# Patient Record
Sex: Male | Born: 1970 | Race: White | Hispanic: No | Smoking: Never smoker
Health system: Southern US, Community
[De-identification: ages and names within clinical notes are randomized; demographics above are authoritative.]

## PROBLEM LIST (undated history)

## (undated) DIAGNOSIS — I10 Essential (primary) hypertension: Secondary | ICD-10-CM

## (undated) DIAGNOSIS — N189 Chronic kidney disease, unspecified: Secondary | ICD-10-CM

## (undated) DIAGNOSIS — E119 Type 2 diabetes mellitus without complications: Secondary | ICD-10-CM

## (undated) DIAGNOSIS — I219 Acute myocardial infarction, unspecified: Secondary | ICD-10-CM

## (undated) DIAGNOSIS — I2510 Atherosclerotic heart disease of native coronary artery without angina pectoris: Secondary | ICD-10-CM

## (undated) HISTORY — PX: APPENDECTOMY: SHX54

## (undated) HISTORY — DX: Essential (primary) hypertension: I10

## (undated) HISTORY — DX: Chronic kidney disease, unspecified: N18.9

## (undated) HISTORY — PX: TOOTH EXTRACTION: SUR596

## (undated) HISTORY — PX: EYE SURGERY: SHX253

## (undated) HISTORY — PX: CORONARY ARTERY BYPASS GRAFT: SHX141

---

## 2018-03-23 ENCOUNTER — Encounter (HOSPITAL_COMMUNITY): Payer: Self-pay | Admitting: *Deleted

## 2018-03-23 ENCOUNTER — Observation Stay (HOSPITAL_COMMUNITY)
Admission: EM | Admit: 2018-03-23 | Discharge: 2018-03-24 | Disposition: A | Discharge status: Home / Self Care | Payer: Managed Care, Other (non HMO) | Attending: Family Medicine | Admitting: Family Medicine

## 2018-03-23 ENCOUNTER — Other Ambulatory Visit: Payer: Self-pay

## 2018-03-23 ENCOUNTER — Emergency Department (HOSPITAL_COMMUNITY): Payer: Managed Care, Other (non HMO)

## 2018-03-23 DIAGNOSIS — I252 Old myocardial infarction: Secondary | ICD-10-CM | POA: Diagnosis not present

## 2018-03-23 DIAGNOSIS — Z951 Presence of aortocoronary bypass graft: Secondary | ICD-10-CM | POA: Diagnosis present

## 2018-03-23 DIAGNOSIS — E10319 Type 1 diabetes mellitus with unspecified diabetic retinopathy without macular edema: Secondary | ICD-10-CM | POA: Diagnosis present

## 2018-03-23 DIAGNOSIS — N183 Chronic kidney disease, stage 3 unspecified: Secondary | ICD-10-CM | POA: Diagnosis present

## 2018-03-23 DIAGNOSIS — Z794 Long term (current) use of insulin: Secondary | ICD-10-CM | POA: Diagnosis not present

## 2018-03-23 DIAGNOSIS — I129 Hypertensive chronic kidney disease with stage 1 through stage 4 chronic kidney disease, or unspecified chronic kidney disease: Secondary | ICD-10-CM | POA: Diagnosis not present

## 2018-03-23 DIAGNOSIS — Z8249 Family history of ischemic heart disease and other diseases of the circulatory system: Secondary | ICD-10-CM | POA: Insufficient documentation

## 2018-03-23 DIAGNOSIS — E875 Hyperkalemia: Secondary | ICD-10-CM | POA: Diagnosis present

## 2018-03-23 DIAGNOSIS — N184 Chronic kidney disease, stage 4 (severe): Secondary | ICD-10-CM | POA: Diagnosis not present

## 2018-03-23 DIAGNOSIS — I2510 Atherosclerotic heart disease of native coronary artery without angina pectoris: Secondary | ICD-10-CM | POA: Insufficient documentation

## 2018-03-23 DIAGNOSIS — Z9889 Other specified postprocedural states: Secondary | ICD-10-CM | POA: Insufficient documentation

## 2018-03-23 DIAGNOSIS — R0789 Other chest pain: Secondary | ICD-10-CM | POA: Diagnosis not present

## 2018-03-23 DIAGNOSIS — Z7982 Long term (current) use of aspirin: Secondary | ICD-10-CM | POA: Insufficient documentation

## 2018-03-23 DIAGNOSIS — Z79899 Other long term (current) drug therapy: Secondary | ICD-10-CM | POA: Insufficient documentation

## 2018-03-23 DIAGNOSIS — E1022 Type 1 diabetes mellitus with diabetic chronic kidney disease: Secondary | ICD-10-CM | POA: Insufficient documentation

## 2018-03-23 DIAGNOSIS — I25119 Atherosclerotic heart disease of native coronary artery with unspecified angina pectoris: Secondary | ICD-10-CM

## 2018-03-23 DIAGNOSIS — I10 Essential (primary) hypertension: Secondary | ICD-10-CM | POA: Diagnosis present

## 2018-03-23 DIAGNOSIS — R079 Chest pain, unspecified: Secondary | ICD-10-CM | POA: Diagnosis present

## 2018-03-23 HISTORY — DX: Acute myocardial infarction, unspecified: I21.9

## 2018-03-23 HISTORY — DX: Atherosclerotic heart disease of native coronary artery without angina pectoris: I25.10

## 2018-03-23 HISTORY — DX: Type 2 diabetes mellitus without complications: E11.9

## 2018-03-23 LAB — BASIC METABOLIC PANEL
Anion gap: 12 (ref 5–15)
BUN: 66 mg/dL — ABNORMAL HIGH (ref 6–20)
CHLORIDE: 105 mmol/L (ref 98–111)
CO2: 21 mmol/L — ABNORMAL LOW (ref 22–32)
Calcium: 9.2 mg/dL (ref 8.9–10.3)
Creatinine, Ser: 2.97 mg/dL — ABNORMAL HIGH (ref 0.61–1.24)
GFR calc Af Amer: 28 mL/min — ABNORMAL LOW (ref >60)
GFR calc non Af Amer: 24 mL/min — ABNORMAL LOW (ref >60)
Glucose, Bld: 113 mg/dL — ABNORMAL HIGH (ref 70–99)
Potassium: 5.2 mmol/L — ABNORMAL HIGH (ref 3.5–5.1)
Sodium: 138 mmol/L (ref 135–145)

## 2018-03-23 LAB — CBC
HCT: 35 % — ABNORMAL LOW (ref 39.0–52.0)
Hemoglobin: 11.4 g/dL — ABNORMAL LOW (ref 13.0–17.0)
MCH: 30.4 pg (ref 26.0–34.0)
MCHC: 32.6 g/dL (ref 30.0–36.0)
MCV: 93.3 fL (ref 80.0–100.0)
Platelets: 206 10*3/uL (ref 150–400)
RBC: 3.75 MIL/uL — ABNORMAL LOW (ref 4.22–5.81)
RDW: 11.8 % (ref 11.5–15.5)
WBC: 6.8 10*3/uL (ref 4.0–10.5)
nRBC: 0 % (ref 0.0–0.2)

## 2018-03-23 LAB — I-STAT TROPONIN, ED
TROPONIN I, POC: 0.01 ng/mL (ref 0.00–0.08)
Troponin i, poc: 0 ng/mL (ref 0.00–0.08)

## 2018-03-23 LAB — CBG MONITORING, ED: Glucose-Capillary: 203 mg/dL — ABNORMAL HIGH (ref 70–99)

## 2018-03-23 LAB — D-DIMER, QUANTITATIVE: D-Dimer, Quant: 0.27 ug/mL-FEU (ref 0.00–0.50)

## 2018-03-23 LAB — TROPONIN I: Troponin I: <0.03 ng/mL (ref <0.03)

## 2018-03-23 MED ORDER — NITROGLYCERIN 0.4 MG SL SUBL
0.4000 mg | SUBLINGUAL_TABLET | SUBLINGUAL | Status: DC | PRN
  Administered 2018-03-23 (×2): 0.4 mg via SUBLINGUAL
  Filled 2018-03-23: qty 1

## 2018-03-23 MED ORDER — ENOXAPARIN SODIUM 30 MG/0.3ML ~~LOC~~ SOLN
30.0000 mg | Freq: Every day | SUBCUTANEOUS | Status: DC
  Administered 2018-03-24: 30 mg via SUBCUTANEOUS
  Filled 2018-03-23: qty 0.3

## 2018-03-23 MED ORDER — ONDANSETRON HCL 4 MG/2ML IJ SOLN: 4.0000 mg | Freq: Four times a day (QID) | INTRAMUSCULAR | Status: DC | PRN

## 2018-03-23 MED ORDER — ASPIRIN 81 MG PO CHEW
324.0000 mg | CHEWABLE_TABLET | Freq: Once | ORAL | Status: AC
  Administered 2018-03-23: 324 mg via ORAL
  Filled 2018-03-23: qty 4

## 2018-03-23 MED ORDER — MORPHINE SULFATE (PF) 2 MG/ML IV SOLN: 2.0000 mg | INTRAVENOUS | Status: DC | PRN

## 2018-03-23 MED ORDER — FUROSEMIDE 20 MG PO TABS
20.0000 mg | ORAL_TABLET | Freq: Every day | ORAL | Status: DC
  Administered 2018-03-24: 20 mg via ORAL
  Filled 2018-03-23: qty 1

## 2018-03-23 MED ORDER — METOPROLOL TARTRATE 25 MG PO TABS
25.0000 mg | ORAL_TABLET | Freq: Three times a day (TID) | ORAL | Status: DC
  Administered 2018-03-23 – 2018-03-24 (×3): 25 mg via ORAL
  Filled 2018-03-23 (×3): qty 1

## 2018-03-23 MED ORDER — LACTATED RINGERS IV BOLUS
1000.0000 mL | Freq: Once | INTRAVENOUS | Status: AC
  Administered 2018-03-23: 1000 mL via INTRAVENOUS

## 2018-03-23 MED ORDER — ACETAMINOPHEN 325 MG PO TABS: 650.0000 mg | ORAL_TABLET | ORAL | Status: DC | PRN

## 2018-03-23 MED ORDER — ASPIRIN EC 81 MG PO TBEC
81.0000 mg | DELAYED_RELEASE_TABLET | Freq: Every day | ORAL | Status: DC
  Administered 2018-03-24: 81 mg via ORAL
  Filled 2018-03-23: qty 1

## 2018-03-23 MED ORDER — INSULIN ASPART 100 UNIT/ML ~~LOC~~ SOLN
0.0000 [IU] | SUBCUTANEOUS | Status: DC
  Administered 2018-03-24: 2 [IU] via SUBCUTANEOUS

## 2018-03-23 MED ORDER — ATORVASTATIN CALCIUM 80 MG PO TABS
80.0000 mg | ORAL_TABLET | Freq: Every evening | ORAL | Status: DC
  Administered 2018-03-23: 80 mg via ORAL
  Filled 2018-03-23: qty 1

## 2018-03-23 MED ORDER — INSULIN GLARGINE 100 UNIT/ML ~~LOC~~ SOLN
12.0000 [IU] | Freq: Every day | SUBCUTANEOUS | Status: DC
  Filled 2018-03-23: qty 0.12

## 2018-03-23 NOTE — Consult Note (Signed)
Cardiology Consultation:   Patient ID: Willie Fletcher MRN: [id number]; DOB: [birth year]  Admit date: 03/23/2018 Date of Consult: 03/23/2018  Primary Care Provider: No primary care provider on file. Primary Cardiologist: Dr Prentice Docker ([place], [place] at [facility]). Moved, has not established with cardiology here Primary Electrophysiologist:  None    Patient Profile:   Willie Fletcher is a 47 y.o. male with a hx of CAD with prior CABG who is being seen today for the evaluation of chest pain at the request of Dr Roel Cluck.  History of Present Illness:   Willie Fletcher 47 yo male history of type I DM, CAD with CABG in 2017 with LIMA-LAD, SVG-diag, SVG-OM in [place] (notes on Care everywhere). CKD IV, HTN. From notes he presented in 2017 with chest pain and positive biomarkers. LVEF 45% by echo at that time.   From patients reports he has done well since his CABG in 2017 with no significant chest pain or SOB. Reports this past Friday some generalized fatigue, nonspecific chest pain symptoms. Today around 11AM while at break from work developed chest pain. 5/10 pain left upper chest with some SOB, pressure in left arm. Worst with deep breathing. Constant pain for about 4 hours, better with SL NG in ER. Similar to his pain in 2017 prior to his CABG but more intense.    K 5. 2 Cr 2.97 WBC 6.8 Hgb 11.4 Plt 206  Trop neg x 1 EKG SR, LVH no acute ischemic changes CXR no acute process Past Medical History:  Diagnosis Date  . Coronary artery disease   . Diabetes mellitus without complication ([place])   . Myocardial infarct [facility])     Past Surgical History:  Procedure Laterality Date  . CORONARY ARTERY BYPASS GRAFT        Inpatient Medications: Scheduled Meds:  Continuous Infusions:  PRN Meds: nitroGLYCERIN  Allergies:   No Known Allergies  Social History:   Social History   Socioeconomic History  . Marital status: Single    Spouse name: Not on file  . Number of children:  Not on file  . Years of education: Not on file  . Highest education level: Not on file  Occupational History  . Not on file  Social Needs  . Financial resource strain: Not on file  . Food insecurity:    Worry: Not on file    Inability: Not on file  . Transportation needs:    Medical: Not on file    Non-medical: Not on file  Tobacco Use  . Smoking status: Never Smoker  . Smokeless tobacco: Never Used  Substance and Sexual Activity  . Alcohol use: Not on file  . Drug use: Not on file  . Sexual activity: Not on file  Lifestyle  . Physical activity:    Days per week: Not on file    Minutes per session: Not on file  . Stress: Not on file  Relationships  . Social connections:    Talks on phone: Not on file    Gets together: Not on file    Attends religious service: Not on file    Active member of club or organization: Not on file    Attends meetings of clubs or organizations: Not on file    Relationship status: Not on file  . Intimate partner violence:    Fear of current or ex partner: Not on file    Emotionally abused: Not on file    Physically abused: Not on file  Forced sexual activity: Not on file  Other Topics Concern  . Not on file  Social History Narrative  . Not on file    Family History:   Father with history of CAD  ROS:  Please see the history of present illness.  All other ROS reviewed and negative.     Physical Exam/Data:   Vitals:   03/23/18 1812 03/23/18 1816 03/23/18 1817 03/23/18 1845  BP:   (!) 152/90 (!) 143/80  Pulse: 63  65 63  Resp: 10  17 15   Temp:      TempSrc:      SpO2: 100%  100% 100%  Weight:  58.1 kg    Height:  5\' 7"  (1.702 m)     No intake or output data in the 24 hours ending 03/23/18 1938 Filed Weights   03/23/18 1816  Weight: 58.1 kg   Body mass index is 20.05 kg/m.  General:  Well nourished, well developed, in no acute distress HEENT: normal Lymph: no adenopathy Neck: no JVD Endocrine:  No thryomegaly Vascular:  No carotid bruits; FA pulses 2+ bilaterally without bruits  Cardiac:  normal S1, S2; RRR; no murmur Lungs:  clear to auscultation bilaterally, no wheezing, rhonchi or rales  Abd: soft, nontender, no hepatomegaly  Ext: no edema Musculoskeletal:  No deformities, BUE and BLE strength normal and equal Skin: warm and dry  Neuro:  CNs 2-12 intact, no focal abnormalities noted Psych:  Normal affect     Laboratory Data:  Chemistry Recent Labs  Lab 03/23/18 1610  NA 138  K 5.2*  CL 105  CO2 21*  GLUCOSE 113*  BUN 66*  CREATININE 2.97*  CALCIUM 9.2  GFRNONAA 24*  GFRAA 28*  ANIONGAP 12    No results for input(s): PROT, ALBUMIN, AST, ALT, ALKPHOS, BILITOT in the last 168 hours. Hematology Recent Labs  Lab 03/23/18 1610  WBC 6.8  RBC 3.75*  HGB 11.4*  HCT 35.0*  MCV 93.3  MCH 30.4  MCHC 32.6  RDW 11.8  PLT 206   Cardiac EnzymesNo results for input(s): TROPONINI in the last 168 hours.  Recent Labs  Lab 03/23/18 1611  TROPIPOC 0.00    BNPNo results for input(s): BNP, PROBNP in the last 168 hours.  DDimer No results for input(s): DDIMER in the last 168 hours.  Radiology/Studies:  Dg Chest 2 View  Result Date: 03/23/2018 CLINICAL DATA:  Chest pain for 6 hours.  History of CABG. EXAM: CHEST - 2 VIEW COMPARISON:  None. FINDINGS: Cardiomediastinal silhouette is normal. Status post median sternotomy for CABG. No pleural effusions or focal consolidations. Trachea projects midline and there is no pneumothorax. Soft tissue planes and included osseous structures are non-suspicious. IMPRESSION: No active cardiopulmonary process. Electronically Signed   By: Elon Alas M.D.   On: 03/23/2018 17:33     08/2015 Cath Coronary Angiography:                                  Dominance: Right                                                                          Left  Main                                         There was mild diffuse disease of the entire vessel segment of the         left main artery. The left main was large. Distal flow was normal.                                              Left Anterior Descending                                There was severe diffuse disease of the entire vessel segment of the        left anterior descending artery (LAD). The LAD was small. Distal         flow was normal.                                                                           There was severe diffuse disease of the entire vessel segment of the        first diagonal branch (Diagonal 1) of the LAD. The Diagonal 1 was         small. Distal flow was normal.                                                                Left Circumflex                                     There was a single discrete total occlusion of the proximal segment        of the left circumflex artery (LCX). Distal flow was decreased          (TIMI Grade 1).                                                                        Right Coronary Artery                                  There was a single discrete total occlusion of the mid segment of         the right coronary artery (RCA). The RCA was small. Distal flow         was decreased (TIMI  Grade 1) and was via collaterals from the LAD         and collaterals from the RCA.                                                                  Ramus                                          There was severe diffuse disease of the proximal segment of the          ramus. The ramus was small. Distal flow was normal.                                                  Vascular Access:                                    Vascular Access Management:                               Mechanical Compression of the right radial artery access site was         performed.                                                                        Conclusions:                                      * Three vessel coronary artery disease (LAD, LCX and RCA)                                                   Complications/Events:                                  The patient had no complications during these procedures.                                                   Comments:                                        Pt referred for  diagnostic coronary angiography to further evaluate chest     pain and positive biomarkers. Pt is a untreated Type 1 diabetic.         Coronary angiography showed severe diffuse 3VD. CTS evaluation to        consider for surgical bypass. Continue optimal medical therapy.                                                 The attending physician was present for the entire procedure.                                                    Dr. Noralyn Pick, M.D. was present during the moderate sedation  intraservice    time as documented by the sedation nurse. Case time = 00:11.                                                    Dr. Noralyn Pick, M.D. performed the coronary angiography and left heart      catheterization.                                 08/2015 TEE SUMMARY:  1. TEE pre and post CABG surgery 2. Prebypass: LV systolic function is mildly depressed. LVEF is estimated to be 45%. There are wall motion abnormalities as noted in the chart. RV function is normal. There is no hemodynamically significant valvular dysfunction. There is no evidence of PFO by colorflow doppler. 3. Postbypass: LV and RV function are improved. The LVEF is estimated to be 50%. There are wall motion abnormalities as described in the chart. There is no change in valve function. The remainder of the examination is unchanged. 4. See remainder of report for additional findings.   08/2015 echo SUMMARY:  1. The left ventricular chamber size is normal. Borderline concentric left ventricular hypertrophy is observed.  The quantitative left ventricular ejection fraction by biplane Simpson's method is 43%. There are left ventricular segmental wall motion abnormalities present, as shown in the diagram below.  2. Right ventricular chamber size, wall thickness, and systolic function are within normal limits. 3. No signfiicant valvular abnormalities. 4. Other details as noted below. 5. No prior study available for comparison.      Assessment and Plan:   1. CAD - history of CAD with 3 vessel CABG in 2017 at [facility] - mixed chest pain symptoms. Lasted about 4 hours, worst with deep breathing. Better with SL NG, similar to his pain prior to CABG in 2017 - currently pain free, initial enzymes and EKG without acute ischemic changes - due to his CKD would require high risk findings to consider cath. Cycle  enzymes and EKGs overnight, obtain echo tomorrow AM. Keep npo at midnight, likely would plan for noninvasive stress testing unless high risk findings overnight. - if recurrent pain, EKG or enzyme changes would start hep gtt - continue ASA, atorva lisinorpil metoprolol from his home regimen  2. CKD IV -  Cr last year from care everywhere range 2.4 to 3.1, within range on admisison.   For questions or updates, please contact [place] Please consult [email or web address] for contact info under     Signed, Carlyle Dolly, MD  03/23/2018 7:38 PM

## 2018-03-23 NOTE — ED Provider Notes (Signed)
[place] EMERGENCY DEPARTMENT Provider Note   CSN: [id number] Arrival date & time: 03/23/18  1547     History   Chief Complaint Chief Complaint  Patient presents with  . Chest Pain    HPI Willie Fletcher is a 47 y.o. male.  Patient is a 47 year old male with past medical history significant for diabetes, hypertension coronary artery disease status post three-vessel CABG in 2017 presenting for left-sided chest pain that started when he is at work today.  Patient states that he took his medications as prescribed pain started about 12:30 PM, started to decrease 30 minutes prior to arrival.  Patient states that he is still having active left chest sided chest pain at this time with radiation down his left arm with mild numbness.  Patient does note that he is now adherent to his insulin and hypertensive medications, although has never had chest pain since his CABG.  The history is provided by the patient, medical records and the spouse. No language interpreter was used.    Past Medical History:  Diagnosis Date  . Coronary artery disease   . Diabetes mellitus without complication ([place])   . Myocardial infarct ([place])     There are no active problems to display for this patient.   Past Surgical History:  Procedure Laterality Date  . CORONARY ARTERY BYPASS GRAFT          Home Medications    Prior to Admission medications   Not on File    Family History History reviewed. No pertinent family history.  Social History Social History   Tobacco Use  . Smoking status: Never Smoker  . Smokeless tobacco: Never Used  Substance Use Topics  . Alcohol use: Not on file  . Drug use: Not on file     Allergies   Patient has no known allergies.   Review of Systems Review of Systems  Constitutional: Positive for activity change. Negative for chills and fever.  HENT: Negative for ear pain and sore throat.   Eyes: Negative for pain and visual disturbance.    Respiratory: Positive for chest tightness. Negative for cough and shortness of breath.   Cardiovascular: Positive for chest pain. Negative for palpitations.  Gastrointestinal: Negative for abdominal pain and vomiting.  Genitourinary: Negative for dysuria and hematuria.  Musculoskeletal: Negative for arthralgias and back pain.  Skin: Negative for color change and rash.  Neurological: Negative for seizures and syncope.  All other systems reviewed and are negative.    Physical Exam Updated Vital Signs BP (!) 163/80 (BP Location: Right Arm)   Pulse 65   Temp (!) 97.1 F (36.2 C) (Oral)   Resp 18   SpO2 100%   Physical Exam  Constitutional: He appears well-developed and well-nourished.  HENT:  Head: Normocephalic and atraumatic.  Eyes: Conjunctivae are normal.  Neck: Neck supple.  Cardiovascular: Normal rate and regular rhythm.  No murmur heard. Pulmonary/Chest: Effort normal and breath sounds normal. No respiratory distress.  Abdominal: Soft. There is no tenderness.  Musculoskeletal: He exhibits no edema.  Neurological: He is alert.  Skin: Skin is warm and dry.  Psychiatric: He has a normal mood and affect.  Nursing note and vitals reviewed.    ED Treatments / Results  Labs (all labs ordered are listed, but only abnormal results are displayed) Labs Reviewed  BASIC METABOLIC PANEL - Abnormal; Notable for the following components:      Result Value   Potassium 5.2 (*)    CO2  21 (*)    Glucose, Bld 113 (*)    BUN 66 (*)    Creatinine, Ser 2.97 (*)    GFR calc non Af Amer 24 (*)    GFR calc Af Amer 28 (*)    All other components within normal limits  CBC - Abnormal; Notable for the following components:   RBC 3.75 (*)    Hemoglobin 11.4 (*)    HCT 35.0 (*)    All other components within normal limits  I-STAT TROPONIN, ED    EKG None  Radiology Dg Chest 2 View  Result Date: 03/23/2018 CLINICAL DATA:  Chest pain for 6 hours.  History of CABG. EXAM: CHEST - 2  VIEW COMPARISON:  None. FINDINGS: Cardiomediastinal silhouette is normal. Status post median sternotomy for CABG. No pleural effusions or focal consolidations. Trachea projects midline and there is no pneumothorax. Soft tissue planes and included osseous structures are non-suspicious. IMPRESSION: No active cardiopulmonary process. Electronically Signed   By: Elon Alas M.D.   On: 03/23/2018 17:33    Procedures Procedures (including critical care time)  Medications Ordered in ED Medications - No data to display   Initial Impression / Assessment and Plan / ED Course  I have reviewed the triage vital signs and the nursing notes.  Pertinent labs & imaging results that were available during my care of the patient were reviewed by me and considered in my medical decision making (see chart for details).     Patient is a 47 year old male with past medical history significant for insulin-dependent diabetes, hypertension, three-vessel CABG in 2017 presenting for left-sided chest pain, described as squeezing, radiation down his left arm that started at work today.  Initial blood pressure upon arrival was systolic of 342.  Patient states that he is compliant with his antihypertensives.  324 aspirin was given.  Patient was given sublingual nitro which he states resolved completely his chest pain.  EKG was reviewed by myself and my attending physician normal sinus rhythm, ST segment elevation in V3 lead with no reciprocal changes, no previous tracing available.  Chest x-ray unremarkable.  Initial troponin negative.  Patient has a history of CKD, creatinine 1.9. Patient will be admitted for further of observation and management for high risk chest pain. Case was discussed with Dr. Harl Bowie of Cardiology service who states that they will come see the patient.   Final Clinical Impressions(s) / ED Diagnoses   Final diagnoses:  Chest pain, unspecified type    ED Discharge Orders    None         Erskine Squibb, MD 03/23/18 2056    Merrily Pew, MD 03/23/18 2329

## 2018-03-23 NOTE — ED Notes (Signed)
Pt sttes chest pain free, but c/o headaCHE.

## 2018-03-23 NOTE — ED Triage Notes (Signed)
Pt in c/o left sided chest pain that radiates down his arm, similar pain to his previous MI with triple bypass, no distress noted on arrival, denies shortness of breath, pain increases with inspiration

## 2018-03-23 NOTE — Progress Notes (Signed)
   03/23/18 2253  Vitals  Temp (!) 97.5 F (36.4 C)  Temp Source Oral  BP (!) 161/76  MAP (mmHg) 103  BP Location Right Arm  BP Method Automatic  Patient Position (if appropriate) Lying  Pulse Rate 66  Pulse Rate Source Monitor  Resp 16  Oxygen Therapy  SpO2 100 %  O2 Device Room Air  Height and Weight  Height 5\' 7"  (1.702 m)  Weight 55.9 kg  Type of Scale Used Standing (scale C (11-20))  Type of Weight Actual  BSA (Calculated - sq m) 1.63 sq meters  BMI (Calculated) 19.29  Weight in (lb) to have BMI = 25 159.3  Admitted pt to rm 3E15 from ED, pt alert and oriented, denied chest pain at this time, oriented to room, call bell placed within reach, placed on cardiac monitor, CCMD made aware.

## 2018-03-23 NOTE — H&P (Signed)
Willie Fletcher YPP:[id number] DOB: Oct 02, 1970 DOA: 03/23/2018     PCP: No primary care provider on file.   Outpatient Specialists:   CARDS:   Dr. Harlene Ramus, Phylliss Blakes, [place]  Endocrinology: Michaelene Song, DO Patient arrived to ER on 03/23/18 at 1547  Patient coming from: home Lives  With SO   Chief Complaint:  Chief Complaint  Patient presents with  . Chest Pain    HPI: Willie Fletcher is a 47 y.o. male with medical history significant of DM1, CKD, CAD sp CABG, HTN   Presented with left-sided chest pain radiates down his arm similar to prior MI not short of breath pain is worse with inspiration and started while at work today at around 12:30 PM after his mid took his pain medicine and seemed to decrease initially on arrival to emergency department he was still having some mild chest pain. Lasted for 4 h Pain described as squeezing similar to prior MI Initially on arrival blood pressure was up to 190 he has been taking his medications for blood pressure as prescribed patient was given 324 of aspirin and sublingual nitro which helped resolve his chest pain Recently moved to [facility]  Regarding pertinent Chronic problems:   CABG (LIMA-LAD, VG-diagonal, SVG-OM) 2017 History of diabetes type 1 on insulin his A1c was about 7% On Dexcom continuous glucose monitor  BG has been doing well  hypertension on ACE inhibitor  While in ER: EKG appeared to have ST segment elevation in V3 but no reciprocal changes since patient recently moved to [facility] no prior tracing noted Initial troponin unremarkable Now bp down to 143/80  Currently CP free  The following Work up has been ordered so far:  Orders Placed This Encounter  Procedures  . DG Chest 2 View  . Basic metabolic panel  . CBC  . Cardiac monitoring  . Saline Lock IV, Maintain IV access  . Consult for [facility] Admission  . Pulse oximetry, continuous  . I-stat troponin, ED  . I-stat  troponin, ED  . ED EKG within 10 minutes   Following Medications were ordered in ER: Medications  nitroGLYCERIN (NITROSTAT) SL tablet 0.4 mg (0.4 mg Sublingual Given 03/23/18 1818)  lactated ringers bolus 1,000 mL (1,000 mLs Intravenous New Bag/Given 03/23/18 1850)  aspirin chewable tablet 324 mg (324 mg Oral Given 03/23/18 1810)    Significant initial  Findings: Abnormal Labs Reviewed  BASIC METABOLIC PANEL - Abnormal; Notable for the following components:      Result Value   Potassium 5.2 (*)    CO2 21 (*)    Glucose, Bld 113 (*)    BUN 66 (*)    Creatinine, Ser 2.97 (*)    GFR calc non Af Amer 24 (*)    GFR calc Af Amer 28 (*)    All other components within normal limits  CBC - Abnormal; Notable for the following components:   RBC 3.75 (*)    Hemoglobin 11.4 (*)    HCT 35.0 (*)    All other components within normal limits    Lactic Acid, Venous No results found for: LATICACIDVEN  Na 138 K 5.2 D.dimer 0.27 Cr at  baseline  As per Epic Lab Results  Component Value Date   CREATININE 2.97 (H) 03/23/2018   WBC  6.8   HG/HCT stable,      Component Value Date/Time   HGB 11.4 (L) 03/23/2018 1610   HCT 35.0 (L) 03/23/2018 1610  Troponin (Point of Care Test) Recent Labs    03/23/18 1611  TROPIPOC 0.00     BNP (last 3 results) No results for input(s): BNP in the last 8760 hours.  ProBNP (last 3 results) No results for input(s): PROBNP in the last 8760 hours.     UA  not ordered    CXR -  NON acute    ECG:  Personally reviewed by me showing: HR : 63 Rhythm:  NSR,    nonspecific changes, ST elevation in V3 QTC 421     ED Triage Vitals  Enc Vitals Group     BP 03/23/18 1555 (!) 163/80     Pulse Rate 03/23/18 1555 65     Resp 03/23/18 1555 18     Temp 03/23/18 1555 (!) 97.1 F (36.2 C)     Temp Source 03/23/18 1555 Oral     SpO2 03/23/18 1555 100 %     Weight 03/23/18 1816 128 lb (58.1 kg)     Height 03/23/18 1816 5\' 7"  (1.702 m)     Head  Circumference --      Peak Flow --      Pain Score 03/23/18 1812 2     Pain Loc --      Pain Edu? --      Excl. in [place]? --   TMAX(24)@       Latest  Blood pressure (!) 143/80, pulse 63, temperature (!) 97.1 F (36.2 C), temperature source Oral, resp. rate 15, height 5\' 7"  (1.702 m), weight 58.1 kg, SpO2 100 %.    ER Provider Called:  Cardiology     Dr.Branch They Recommend NPO post midnight for possible stress test Will see     in ER  Hospitalist was called for admission for chest pain    Review of Systems:    Pertinent positives include:  chest pain,   Constitutional:  No weight loss, night sweats, Fevers, chills, fatigue, weight loss  HEENT:  No headaches, Difficulty swallowing,Tooth/dental problems,Sore throat,  No sneezing, itching, ear ache, nasal congestion, post nasal drip,  Cardio-vascular:  NoOrthopnea, PND, anasarca, dizziness, palpitations.no Bilateral lower extremity swelling  GI:  No heartburn, indigestion, abdominal pain, nausea, vomiting, diarrhea, change in bowel habits, loss of appetite, melena, blood in stool, hematemesis Resp:  no shortness of breath at rest. No dyspnea on exertion, No excess mucus, no productive cough, No non-productive cough, No coughing up of blood.No change in color of mucus.No wheezing. Skin:  no rash or lesions. No jaundice GU:  no dysuria, change in color of urine, no urgency or frequency. No straining to urinate.  No flank pain.  Musculoskeletal:  No joint pain or no joint swelling. No decreased range of motion. No back pain.  Psych:  No change in mood or affect. No depression or anxiety. No memory loss.  Neuro: no localizing neurological complaints, no tingling, no weakness, no double vision, no gait abnormality, no slurred speech, no confusion  All systems reviewed and apart from [place] all are negative  Past Medical History:   Past Medical History:  Diagnosis Date  . Coronary artery disease   . Diabetes mellitus without  complication ([place])   . Myocardial infarct [facility])       Past Surgical History:  Procedure Laterality Date  . CORONARY ARTERY BYPASS GRAFT      Social History:  Ambulatory   Independently     reports that he has never smoked. He has never used smokeless tobacco. His  alcohol and drug histories are not on file.     Family History:   Family History  Problem Relation Age of Onset  . CAD Father   . Hypertension Father   . Hypertension Other   . Cancer Neg Hx   . Diabetes Neg Hx   . Stroke Neg Hx     Allergies: No Known Allergies   Prior to Admission medications   Medication Sig Start Date End Date Taking? Authorizing Provider  amLODipine (NORVASC) 5 MG tablet Take 5 mg by mouth daily. 02/06/18  Yes [provider]  aspirin EC 81 MG tablet Take 81 mg by mouth daily.   Yes [provider]  atorvastatin (LIPITOR) 80 MG tablet Take 80 mg by mouth every evening. 12/22/17  Yes [provider]  furosemide (LASIX) 20 MG tablet Take 20 mg by mouth daily.   Yes [provider]  Insulin Glargine (BASAGLAR KWIKPEN) 100 UNIT/ML SOPN Inject 12 Units into the skin daily. 02/23/18  Yes [provider]  lisinopril (PRINIVIL,ZESTRIL) 10 MG tablet Take 10 mg by mouth daily. 02/06/18  Yes [provider]  metoprolol tartrate (LOPRESSOR) 25 MG tablet Take 25 mg by mouth every 8 (eight) hours. 02/06/18  Yes [provider]  Multiple Vitamin (MULTIVITAMIN WITH MINERALS) TABS tablet Take 1 tablet by mouth daily.   Yes [provider]  NOVOLOG FLEXPEN 100 UNIT/ML FlexPen Inject 8-20 Units as directed 4 (four) times daily. 01/29/18  Yes [provider]   Physical Exam: Blood pressure (!) 143/80, pulse 63, temperature (!) 97.1 F (36.2 C), temperature source Oral, resp. rate 15, height 5\' 7"  (1.702 m), weight 58.1 kg, SpO2 100 %. 1. General:  in No Acute distress  well  -appearing 2. Psychological: Alert and  Oriented 3.  Head/ENT:   Moist  Mucous Membranes                          Head Non traumatic, neck supple                          Poor Dentition 4. SKIN:  decreased Skin turgor,  Skin clean Dry and intact no rash 5. Heart: Regular rate and rhythm no Murmur, no Rub or gallop 6. Lungs:  Clear to auscultation bilaterally, no wheezes or crackles   7. Abdomen: Soft,  non-tender, Non distended      bowel sounds present 8. Lower extremities: no clubbing, cyanosis, or  edema 9. Neurologically Grossly intact, moving all 4 extremities equally   10. MSK: Normal range of motion   LABS:     Recent Labs  Lab 03/23/18 1610  WBC 6.8  HGB 11.4*  HCT 35.0*  MCV 93.3  PLT 563   Basic Metabolic Panel: Recent Labs  Lab 03/23/18 1610  NA 138  K 5.2*  CL 105  CO2 21*  GLUCOSE 113*  BUN 66*  CREATININE 2.97*  CALCIUM 9.2     No results for input(s): AST, ALT, ALKPHOS, BILITOT, PROT, ALBUMIN in the last 168 hours. No results for input(s): LIPASE, AMYLASE in the last 168 hours. No results for input(s): AMMONIA in the last 168 hours.    HbA1C: No results for input(s): HGBA1C in the last 72 hours. CBG: No results for input(s): GLUCAP in the last 168 hours.    Urine analysis: No results found for: COLORURINE, APPEARANCEUR, LABSPEC, [place], GLUCOSEU, HGBUR, BILIRUBINUR, KETONESUR, PROTEINUR, UROBILINOGEN, NITRITE,  LEUKOCYTESUR     Cultures: No results found for: SDES, [place], CULT, REPTSTATUS   Radiological Exams on Admission: Dg Chest 2 View  Result Date: 03/23/2018 CLINICAL DATA:  Chest pain for 6 hours.  History of CABG. EXAM: CHEST - 2 VIEW COMPARISON:  None. FINDINGS: Cardiomediastinal silhouette is normal. Status post median sternotomy for CABG. No pleural effusions or focal consolidations. Trachea projects midline and there is no pneumothorax. Soft tissue planes and included osseous structures are non-suspicious. IMPRESSION: No active cardiopulmonary process. Electronically Signed   By:  Elon Alas M.D.   On: 03/23/2018 17:33    Chart has been reviewed    Assessment/Plan  47 y.o. male with medical history significant of DM1, CKD, CAD sp CABG, HTN   Admitted for chest pain   Present on Admission: . Chest pain - - H=  1  ,E=  1 ,A= 1 , R  2   , T 0  ,  for the  Total of 5 therefore will admit for observation and further evaluation ( Risk of MACE: Scores 0-3  of 0.9-1.7%.,  4-6: 12-16.6% , Scores ?7: 50-65% )     - monitor on telemetry, cycle cardiac enzymes, obtain serial ECG and  ECHO in AM.   - Daily aspirin -  Further risk stratify with lipid panel, hgA1C, obtain TSH.  Make sure patient is on Aspirin.  We will notify cardiology regarding patient's admission. Further management depends on pending  workup Appreciate cardiology input possibly stress test tomorrow Keep n.p.o. post midnight  . Hyperkalemia -replace and check magnesium level . DM (diabetes mellitus), type 1 ([place]) -  - Order Sensitive  SSI   - continue home insulin regimen   -  check TSH and HgA1C    . Essential hypertension stable resume home medications . CAD (coronary artery disease) -appreciate cardiology input make sure on daily aspirin and statin Continue Lopressor . CKD (chronic kidney disease), stage III (HCC) avoid nephrotoxic medications currently creatinine at baseline will need to follow-up with nephrology as an outpatient   Other plan as per orders.  DVT prophylaxis:    SCD  Code Status: limited code as per patient   I had personally discussed CODE STATUS with patient  Family Communication:   Family not at  Bedside    Disposition Plan:      To home once workup is complete and patient is stable                      Consults called: Cardiology have seen in consult   Admission status:    Obs    Level of care     tele  For [place] 03/23/2018, 10:19 PM    [facility]  Pager [phone]   after 2 AM please page floor coverage PA If 7AM-7PM,  please contact the day team taking care of the patient  [email or web address]  Password [email or web address]

## 2018-03-23 NOTE — ED Notes (Signed)
Pt asked if he could take his home night-time meds.  RN contacted admitting provider who indicated she prefer he wait, she would be seeing him next.  Pt agreeable.

## 2018-03-24 ENCOUNTER — Observation Stay (HOSPITAL_BASED_OUTPATIENT_CLINIC_OR_DEPARTMENT_OTHER): Payer: Managed Care, Other (non HMO)

## 2018-03-24 DIAGNOSIS — R079 Chest pain, unspecified: Secondary | ICD-10-CM

## 2018-03-24 DIAGNOSIS — N183 Chronic kidney disease, stage 3 (moderate): Secondary | ICD-10-CM | POA: Diagnosis not present

## 2018-03-24 DIAGNOSIS — R072 Precordial pain: Secondary | ICD-10-CM

## 2018-03-24 DIAGNOSIS — E1022 Type 1 diabetes mellitus with diabetic chronic kidney disease: Secondary | ICD-10-CM | POA: Diagnosis not present

## 2018-03-24 DIAGNOSIS — I2510 Atherosclerotic heart disease of native coronary artery without angina pectoris: Secondary | ICD-10-CM | POA: Diagnosis not present

## 2018-03-24 LAB — NM MYOCAR MULTI W/SPECT W/WALL MOTION / EF
Estimated workload: 1 METS
MPHR: 172 {beats}/min
Peak HR: 83 {beats}/min
Percent HR: 48 %
Rest HR: 64 {beats}/min

## 2018-03-24 LAB — GLUCOSE, CAPILLARY
Glucose-Capillary: 134 mg/dL — ABNORMAL HIGH (ref 70–99)
Glucose-Capillary: 152 mg/dL — ABNORMAL HIGH (ref 70–99)
Glucose-Capillary: 174 mg/dL — ABNORMAL HIGH (ref 70–99)
Glucose-Capillary: 48 mg/dL — ABNORMAL LOW (ref 70–99)
Glucose-Capillary: 65 mg/dL — ABNORMAL LOW (ref 70–99)
Glucose-Capillary: 83 mg/dL (ref 70–99)

## 2018-03-24 LAB — HEMOGLOBIN A1C
Hgb A1c MFr Bld: 6.6 % — ABNORMAL HIGH (ref 4.8–5.6)
Mean Plasma Glucose: 142.72 mg/dL

## 2018-03-24 LAB — TROPONIN I
Troponin I: <0.03 ng/mL (ref <0.03)
Troponin I: <0.03 ng/mL (ref <0.03)

## 2018-03-24 LAB — ECHOCARDIOGRAM COMPLETE
Height: 67 in
Weight: 1964.8 oz

## 2018-03-24 LAB — HIV ANTIBODY (ROUTINE TESTING W REFLEX): HIV Screen 4th Generation wRfx: NONREACTIVE

## 2018-03-24 MED ORDER — REGADENOSON 0.4 MG/5ML IV SOLN
0.4000 mg | Freq: Once | INTRAVENOUS | Status: AC
  Administered 2018-03-24: 0.4 mg via INTRAVENOUS
  Filled 2018-03-24: qty 5

## 2018-03-24 MED ORDER — DEXTROSE 50 % IV SOLN
12.5000 g | INTRAVENOUS | Status: AC
  Administered 2018-03-24: 12.5 g via INTRAVENOUS

## 2018-03-24 MED ORDER — ASPIRIN EC 81 MG PO TBEC: 81.0000 mg | DELAYED_RELEASE_TABLET | Freq: Every day | ORAL | 11 refills | Status: DC

## 2018-03-24 MED ORDER — DEXTROSE 50 % IV SOLN
INTRAVENOUS | Status: AC
  Filled 2018-03-24: qty 50

## 2018-03-24 MED ORDER — REGADENOSON 0.4 MG/5ML IV SOLN
INTRAVENOUS | Status: AC
  Filled 2018-03-24: qty 5

## 2018-03-24 MED ORDER — TECHNETIUM TC 99M TETROFOSMIN IV KIT
10.0000 | PACK | Freq: Once | INTRAVENOUS | Status: AC | PRN
  Administered 2018-03-24: 10 via INTRAVENOUS

## 2018-03-24 MED ORDER — ACETAMINOPHEN 325 MG PO TABS: 650.0000 mg | ORAL_TABLET | ORAL | 0 refills | Status: AC | PRN

## 2018-03-24 MED ORDER — DEXTROSE-NACL 5-0.45 % IV SOLN
INTRAVENOUS | Status: DC
  Administered 2018-03-24: 10:00:00 via INTRAVENOUS

## 2018-03-24 MED ORDER — TECHNETIUM TC 99M SESTAMIBI GENERIC - CARDIOLITE
30.0000 | Freq: Once | INTRAVENOUS | Status: AC | PRN
  Administered 2018-03-24: 30 via INTRAVENOUS

## 2018-03-24 NOTE — Discharge Summary (Signed)
Willie Fletcher, is a 47 y.o. male  DOB 1970/10/04  MRN [id number].  Admission date:  03/23/2018  Admitting Physician  Toy Baker, MD  Discharge Date:  03/24/2018   Primary MD  Patient, No Pcp Per  Recommendations for primary care physician for things to follow:   1)Stress Test Results--- Normal resting and stress perfusion. No ischemia or infarction EF estimated at 50% but normal by Echo done same day Overall Study Impression Myocardial perfusion is normal. The study is normal. This is a low risk study. Overall left ventricular systolic function was normal. LV cavity size is normal.  There is no prior study for comparison  2) you have stage III chronic kidney disease so Avoid ibuprofen/Advil/Aleve/Motrin/Goody Powders/Naproxen/BC powders/Meloxicam/Diclofenac/Indomethacin and other Nonsteroidal anti-inflammatory medications as these will make you more likely to bleed and can cause stomach ulcers, can also cause Kidney problems.   2) diabetic diet as well as low-salt low-cholesterol diet advised  3) continue all your medications as previously prescribed  4) follow up to primary care physician for reevaluation, please call or return if chest discomfort persist   Admission Diagnosis  Chest pain, unspecified type [R07.9]   Discharge Diagnosis  Chest pain, unspecified type [R07.9]    Active Problems:   Chest pain   Hyperkalemia   DM (diabetes mellitus), type 1 ([place])   Essential hypertension   CAD (coronary artery disease)   CKD (chronic kidney disease), stage III ([place])      Past Medical History:  Diagnosis Date  . Coronary artery disease   . Diabetes mellitus without complication ([place])   . Myocardial infarct [facility])     Past Surgical History:  Procedure Laterality Date  . CORONARY ARTERY BYPASS GRAFT       HPI  from the history and physical done on the day of admission:   HPI: Willie Fletcher is a 47 y.o. male with medical history significant of DM1, CKD, CAD sp CABG, HTN   Presented with left-sided chest pain radiates down his arm similar to prior MI not short of breath pain is worse with inspiration and started while at work today at around 12:30 PM after his mid took his pain medicine and seemed to decrease initially on arrival to emergency department he was still having some mild chest pain. Lasted for 4 h Pain described as squeezing similar to prior MI Initially on arrival blood pressure was up to 190 he has been taking his medications for blood pressure as prescribed patient was given 324 of aspirin and sublingual nitro which helped resolve his chest pain Recently moved to [facility]  Regarding pertinent Chronic problems:   CABG (LIMA-LAD, VG-diagonal, SVG-OM) 2017 History of diabetes type 1 on insulin his A1c was about 7% On Dexcom continuous glucose monitor  BG has been doing well  hypertension on ACE inhibitor  While in ER: EKG appeared to have ST segment elevation in V3 but no reciprocal changes since patient recently moved to [place] no prior tracing noted Initial troponin  unremarkable Now bp down to 143/80  Currently CP free   Hospital Course:     1)Stress Test Results--- Normal resting and stress perfusion. No ischemia or infarction EF estimated at 50% but normal by Echo done same day Overall Study Impression Myocardial perfusion is normal. The study is normal. This is a low risk study. Overall left ventricular systolic function was normal. LV cavity size is normal.  There is no prior study for comparison    Plan:- 1)Atypical chest pain--history of CAD with prior CABG , ruled out for ACS by cardiac enzymes and EKG, patient remains chest pain-free, nuclear stress test without reversible ischemia, cardiology consult appreciated, okay to discharge home with outpatient follow-up, continue aspirin, continue Lipitor, metoprolol and  lisinopril  2)DM 1-A1c 6.6 reflecting excellent diabetic control, continue current diabetic regimen with insulin   3)HTN--- stable, okay to continue amlodipine, metoprolol lisinopril  4) CKD (chronic kidney disease), stage III ----avoid nephrotoxic medications/agenta, review of care everywhere reveals that patient renal function currently at baseline with creatinine close to 3  Discharge Condition: stable  Follow UP--with PCP for recheck sooner if symptoms persist   Consults obtained -cardiology  Diet and Activity recommendation:  As advised  Discharge Instructions    Discharge Instructions    Call MD for:  difficulty breathing, headache or visual disturbances   Complete by:  As directed    Call MD for:  persistant dizziness or light-headedness   Complete by:  As directed    Call MD for:  persistant nausea and vomiting   Complete by:  As directed    Call MD for:  temperature >100.4   Complete by:  As directed    Diet - low sodium heart healthy   Complete by:  As directed    Diet Carb Modified   Complete by:  As directed    Discharge instructions   Complete by:  As directed    1)Stress Test Results--- Normal resting and stress perfusion. No ischemia or infarction EF estimated at 50% but normal by Echo done same day Overall Study Impression Myocardial perfusion is normal. The study is normal. This is a low risk study. Overall left ventricular systolic function was normal. LV cavity size is normal.  There is no prior study for comparison  2) you have stage III chronic kidney disease so Avoid ibuprofen/Advil/Aleve/Motrin/Goody Powders/Naproxen/BC powders/Meloxicam/Diclofenac/Indomethacin and other Nonsteroidal anti-inflammatory medications as these will make you more likely to bleed and can cause stomach ulcers, can also cause Kidney problems.   2) diabetic diet as well as low-salt low-cholesterol diet advised  3) continue all your medications as previously prescribed  4)  follow up to primary care physician for reevaluation, please call or return if chest discomfort persist   Increase activity slowly   Complete by:  As directed        Discharge Medications     Allergies as of 03/24/2018   No Known Allergies     Medication List    TAKE these medications   acetaminophen 325 MG tablet Commonly known as:  TYLENOL Take 2 tablets (650 mg total) by mouth every 4 (four) hours as needed for mild pain, moderate pain, fever or headache.   amLODipine 5 MG tablet Commonly known as:  NORVASC Take 5 mg by mouth daily.   aspirin EC 81 MG tablet Take 1 tablet (81 mg total) by mouth daily. With Food What changed:  additional instructions   atorvastatin 80 MG tablet Commonly known as:  LIPITOR Take  80 mg by mouth every evening.   BASAGLAR KWIKPEN 100 UNIT/ML Sopn Inject 12 Units into the skin daily.   furosemide 20 MG tablet Commonly known as:  LASIX Take 20 mg by mouth daily.   lisinopril 10 MG tablet Commonly known as:  PRINIVIL,ZESTRIL Take 10 mg by mouth daily.   metoprolol tartrate 25 MG tablet Commonly known as:  LOPRESSOR Take 25 mg by mouth every 8 (eight) hours.   multivitamin with minerals Tabs tablet Take 1 tablet by mouth daily.   NOVOLOG FLEXPEN 100 UNIT/ML FlexPen Generic drug:  insulin aspart Inject 8-20 Units as directed 4 (four) times daily.      Major procedures and Radiology Reports - PLEASE review detailed and final reports for all details, in brief -  Dg Chest 2 View  Result Date: 03/23/2018 CLINICAL DATA:  Chest pain for 6 hours.  History of CABG. EXAM: CHEST - 2 VIEW COMPARISON:  None. FINDINGS: Cardiomediastinal silhouette is normal. Status post median sternotomy for CABG. No pleural effusions or focal consolidations. Trachea projects midline and there is no pneumothorax. Soft tissue planes and included osseous structures are non-suspicious. IMPRESSION: No active cardiopulmonary process. Electronically Signed   By:  Elon Alas M.D.   On: 03/23/2018 17:33   Nm Myocar Multi W/spect W/wall Motion / Ef  Result Date: 03/24/2018  There was no ST segment deviation noted during stress.  The study is normal.  This is a low risk study.  The left ventricular ejection fraction is mildly decreased (45-54%).  Blood pressure demonstrated a normal response to exercise.  Normal resting and stress perfusion. No ischemia or infarction EF EF estimated at 50% but normal by Echo done same day    Micro Results  No results found for this or any previous visit (from the past 240 hour(s)).   Today   Subjective    Willie Fletcher today has no complaints, chest pain-free, significant other at bedside, no new concerns, no dyspnea on exertion no palpitations no dizziness no pleuritic symptoms no leg pains...           Patient has been seen and examined prior to discharge   Objective   Blood pressure 138/69, pulse 68, temperature 98.7 F (37.1 C), temperature source Oral, resp. rate 16, height 5\' 7"  (1.702 m), weight 55.7 kg, SpO2 100 %.   Intake/Output Summary (Last 24 hours) at 03/24/2018 1539 Last data filed at 03/24/2018 1400 Gross per 24 hour  Intake 321.47 ml  Output 1600 ml  Net -1278.53 ml    Exam Gen:- Awake Alert, no acute distress  HEENT:- [place].AT, No sclera icterus Neck-Supple Neck,No JVD,.  Lungs-  CTAB , good air movement bilaterally  CV- S1, S2 normal, regular, CABG scar Abd-  +ve B.Sounds, Abd Soft, No tenderness,    Extremity/Skin:- No  edema,   good pulses Psych-affect is appropriate, oriented x3 Neuro-no new focal deficits, no tremors    Data Review   CBC w Diff:  Lab Results  Component Value Date   WBC 6.8 03/23/2018   HGB 11.4 (L) 03/23/2018   HCT 35.0 (L) 03/23/2018   PLT 206 03/23/2018    CMP:  Lab Results  Component Value Date   NA 138 03/23/2018   K 5.2 (H) 03/23/2018   CL 105 03/23/2018   CO2 21 (L) 03/23/2018   BUN 66 (H) 03/23/2018   CREATININE 2.97 (H)  03/23/2018   Total Discharge time is about 33 minutes  Roxan Hockey M.D on 03/24/2018 at  3:39 PM  Pager---[phone]  Go to [email or web address] - password [email or web address] for contact info  [facility] - Office  [phone]

## 2018-03-24 NOTE — Progress Notes (Signed)
Patient ready for discharge. 

## 2018-03-24 NOTE — Progress Notes (Signed)
  Echocardiogram 2D Echocardiogram has been performed.  Cristen Bredeson G Amee Boothe 03/24/2018, 10:06 AM

## 2018-03-24 NOTE — Progress Notes (Signed)
   Patient presented for [facility]. Patient tolerated procedure well. Final stress reading pending.  Darreld Mclean, PA-C 03/24/2018 12:52 PM

## 2018-03-24 NOTE — Discharge Instructions (Signed)
1)Stress Test Results--- Normal resting and stress perfusion. No ischemia or infarction EF estimated at 50% but normal by Echo done same day Overall Study Impression Myocardial perfusion is normal. The study is normal. This is a low risk study. Overall left ventricular systolic function was normal. LV cavity size is normal.  There is no prior study for comparison  2) you have stage III chronic kidney disease so Avoid ibuprofen/Advil/Aleve/Motrin/Goody Powders/Naproxen/BC powders/Meloxicam/Diclofenac/Indomethacin and other Nonsteroidal anti-inflammatory medications as these will make you more likely to bleed and can cause stomach ulcers, can also cause Kidney problems.   2) diabetic diet as well as low-salt low-cholesterol diet advised  3) continue all your medications as previously prescribed  4) follow up to primary care physician for reevaluation, please call or return if chest discomfort persist

## 2018-03-24 NOTE — Progress Notes (Signed)
Progress Note  Patient Name: Willie Fletcher Date of Encounter: 03/24/2018  Primary Cardiologist: Dr Harl Bowie  Subjective   No chest pain or dyspnea  Inpatient Medications    Scheduled Meds: . aspirin EC  81 mg Oral Daily  . atorvastatin  80 mg Oral QPM  . enoxaparin (LOVENOX) injection  30 mg Subcutaneous Daily  . furosemide  20 mg Oral Daily  . insulin aspart  0-9 Units Subcutaneous Q4H  . metoprolol tartrate  25 mg Oral Q8H   Continuous Infusions: . dextrose 5 % and 0.45% NaCl     PRN Meds: acetaminophen, morphine injection, nitroGLYCERIN, ondansetron (ZOFRAN) IV   Vital Signs    Vitals:   03/23/18 2215 03/23/18 2253 03/24/18 0353 03/24/18 0355  BP: (!) 164/85 (!) 161/76 140/84   Pulse: 66 66 69   Resp: 14 16 17    Temp:  (!) 97.5 F (36.4 C) 97.8 F (36.6 C)   TempSrc:  Oral Oral   SpO2: 100% 100% 97%   Weight:  55.9 kg  55.7 kg  Height:  5\' 7"  (1.702 m)      Intake/Output Summary (Last 24 hours) at 03/24/2018 0852 Last data filed at 03/24/2018 0272 Gross per 24 hour  Intake 120 ml  Output 1600 ml  Net -1480 ml   Filed Weights   03/23/18 1816 03/23/18 2253 03/24/18 0355  Weight: 58.1 kg 55.9 kg 55.7 kg    Telemetry    Sinus- Personally Reviewed    Physical Exam   GEN: No acute distress.   Neck: No JVD Cardiac: RRR, no murmurs, rubs, or gallops.  Respiratory: Clear to auscultation bilaterally. GI: Soft, nontender, non-distended  MS: No edema Neuro:  Nonfocal  Psych: Normal affect   Labs    Chemistry Recent Labs  Lab 03/23/18 1610  NA 138  K 5.2*  CL 105  CO2 21*  GLUCOSE 113*  BUN 66*  CREATININE 2.97*  CALCIUM 9.2  GFRNONAA 24*  GFRAA 28*  ANIONGAP 12     Hematology Recent Labs  Lab 03/23/18 1610  WBC 6.8  RBC 3.75*  HGB 11.4*  HCT 35.0*  MCV 93.3  MCH 30.4  MCHC 32.6  RDW 11.8  PLT 206    Cardiac Enzymes Recent Labs  Lab 03/23/18 1958 03/24/18 0021 03/24/18 0430  TROPONINI <0.03 <0.03 <0.03      Recent Labs  Lab 03/23/18 1611 03/23/18 2008  TROPIPOC 0.00 0.01      DDimer  Recent Labs  Lab 03/23/18 1958  DDIMER 0.27     Radiology    Dg Chest 2 View  Result Date: 03/23/2018 CLINICAL DATA:  Chest pain for 6 hours.  History of CABG. EXAM: CHEST - 2 VIEW COMPARISON:  None. FINDINGS: Cardiomediastinal silhouette is normal. Status post median sternotomy for CABG. No pleural effusions or focal consolidations. Trachea projects midline and there is no pneumothorax. Soft tissue planes and included osseous structures are non-suspicious. IMPRESSION: No active cardiopulmonary process. Electronically Signed   By: Elon Alas M.D.   On: 03/23/2018 17:33    Patient Profile     47 y.o. male with past medical history of coronary artery disease status post coronary artery bypass and graft in [place], chronic stage IV kidney disease, hypertension admitted with chest pain.  Assessment & Plan    1 chest pain-all enzymes are negative.  Electrocardiogram shows no ST changes.  D-dimer is normal.  Symptoms somewhat atypical in that duration 6 hours with negative enzymes.  I will arrange a stress nuclear study for risk stratification.  2 coronary artery disease status post coronary artery bypass and graft-plan to continue aspirin and statin.  3 chronic stage IV kidney disease-patient will need to establish with nephrology following discharge.  4 hyperlipidemia-continue statin.  5 hypertension-would resume all preadmission medications at discharge.  For questions or updates, please contact [place] Please consult [email or web address] for contact info under        Signed, Kirk Ruths, MD  03/24/2018, 8:52 AM

## 2018-03-24 NOTE — Progress Notes (Signed)
Hypoglycemic Event  CBG: 65  Treatment: 1 cup cranberry juice  Symptoms: none  Follow-up CBG: 0355                    CBG Result:79  Possible Reasons for Event: pt got basal insulin  Comments/MD notified: protocol followed: pt NPO but [place] NP ordered ok for pt to drink juice.    Doree Albee [place]

## 2018-03-24 NOTE — Care Management Note (Signed)
Case Management Note  Patient Details  Name: Willie Fletcher MRN: [id number] Date of Birth: 01-Apr-1971  Subjective/Objective:    Chest Pain               Action/Plan: Patient is independent of all of his ADL's;no PCP; has private insurance with [name]; patient is agreeable to have CM assist him in finding a new PCP; follow up apt made with Dr Grier Mitts with [facility] for Apr 01, 2018 at 1:30 pm; patient is also requesting a referral to a Nephrologist; referral sent to [facility]; they will contact the patient and let him know which Nephrologist can see him.   Expected Discharge Date:  03/24/18               Expected Discharge Plan:  Home/Self Care  Discharge planning Services  CM Consult, Follow-up appt scheduled  Status of Service:  In process, will continue to follow  Sherrilyn Rist [phone] 03/24/2018, 3:52 PM

## 2018-03-24 NOTE — Progress Notes (Signed)
Inpatient Diabetes Program Recommendations  AACE/ADA: New Consensus Statement on Inpatient Glycemic Control (2015)  Target Ranges:  Prepandial:   less than 140 mg/dL      Peak postprandial:   less than 180 mg/dL (1-2 hours)      Critically ill patients:  140 - 180 mg/dL   Lab Results  Component Value Date   GLUCAP 174 (H) 03/24/2018   HGBA1C 6.6 (H) 03/24/2018    Review of Glycemic Control Results for Willie Fletcher, Willie Fletcher (MRN [id number]) as of 03/24/2018 15:10  Ref. Range 03/23/2018 22:28 03/24/2018 00:26 03/24/2018 03:38 03/24/2018 08:03 03/24/2018 10:18 03/24/2018 10:45 03/24/2018 13:43  Glucose-Capillary Latest Ref Range: 70 - 99 mg/dL 203 (H) 152 (H) 65 (L) 83 48 (L) 134 (H) 174 (H)   Diabetes history: Type 1 DM  Outpatient Diabetes medications: Basaglar 12 units daily, Novolog 8-20 units tid with meals (patient states he covers CHO at 1 unit/14 grams of CHO for breakfast and lunch and 1 unit for every 12 grams CHO for dinner) Current orders for Inpatient glycemic control:  Novolog sensitive tid with meals  Inpatient Diabetes Program Recommendations:    Spoke with patient on phone regarding when his last dose of Basaglar was.  He states that he took last dose at 1900 on 03/23/18.  He also states that he covers CHO for meal coverage (1 unit/14 grams of CHO breakfast and lunch and 1 unit for every 12 grams of CHO with supper).  Please add Lantus 12 units at 1900 this evening.  Also please add Novolog 4 units tid with meals (hold if patient eats less than 50%).  Encouraged patient to alert RN if he is uncomfortable with insulin doses and that MD can be called for dose adjustments as needed since he has Type 1 DM and insulin needs may vary.   Sent Secure text to MD regarding recommendations.    Thanks,  Adah Perl, RN, BC-ADM Inpatient Diabetes Coordinator Pager [phone] (8a-5p)

## 2018-03-24 NOTE — Plan of Care (Signed)
  Problem: Education: Goal: Knowledge of General Education information will improve Description Including pain rating scale, medication(s)/side effects and non-pharmacologic comfort measures Outcome: Adequate for Discharge   Problem: Health Behavior/Discharge Planning: Goal: Ability to manage health-related needs will improve Outcome: Adequate for Discharge   

## 2018-03-25 LAB — HM DIABETES EYE EXAM

## 2018-04-01 ENCOUNTER — Encounter: Payer: Self-pay | Admitting: Family Medicine

## 2018-04-01 ENCOUNTER — Ambulatory Visit: Payer: Managed Care, Other (non HMO) | Admitting: Family Medicine

## 2018-04-01 VITALS — BP 126/68 | HR 66 | Temp 97.8°F | Ht 67.0 in | Wt 126.0 lb

## 2018-04-01 DIAGNOSIS — I2510 Atherosclerotic heart disease of native coronary artery without angina pectoris: Secondary | ICD-10-CM | POA: Diagnosis not present

## 2018-04-01 DIAGNOSIS — E1022 Type 1 diabetes mellitus with diabetic chronic kidney disease: Secondary | ICD-10-CM | POA: Diagnosis not present

## 2018-04-01 DIAGNOSIS — E10319 Type 1 diabetes mellitus with unspecified diabetic retinopathy without macular edema: Secondary | ICD-10-CM | POA: Insufficient documentation

## 2018-04-01 DIAGNOSIS — E875 Hyperkalemia: Secondary | ICD-10-CM

## 2018-04-01 DIAGNOSIS — I10 Essential (primary) hypertension: Secondary | ICD-10-CM

## 2018-04-01 DIAGNOSIS — Z7689 Persons encountering health services in other specified circumstances: Secondary | ICD-10-CM

## 2018-04-01 DIAGNOSIS — N183 Chronic kidney disease, stage 3 (moderate): Secondary | ICD-10-CM

## 2018-04-01 LAB — BASIC METABOLIC PANEL
BUN: 52 mg/dL — ABNORMAL HIGH (ref 6–23)
CALCIUM: 9 mg/dL (ref 8.4–10.5)
CO2: 27 mEq/L (ref 19–32)
Chloride: 102 mEq/L (ref 96–112)
Creatinine, Ser: 2.44 mg/dL — ABNORMAL HIGH (ref 0.40–1.50)
GFR: 30.18 mL/min — ABNORMAL LOW (ref >60.00)
Glucose, Bld: 257 mg/dL — ABNORMAL HIGH (ref 70–99)
Potassium: 5.1 mEq/L (ref 3.5–5.1)
Sodium: 137 mEq/L (ref 135–145)

## 2018-04-01 MED ORDER — DEXCOM G6 SENSOR MISC: 1.0000 | 2 refills | Status: DC

## 2018-04-01 NOTE — Patient Instructions (Signed)
Preventing Diabetes Mellitus Complications You can take action to prevent or slow down problems that are caused by diabetes (diabetes mellitus). Following your diabetes plan and taking care of yourself can reduce your risk of serious or life-threatening complications. What actions can I take to prevent diabetes complications? Manage your diabetes   Follow instructions from your health care providers about managing your diabetes. Your diabetes may be managed by a team of health care providers who can teach you how to care for yourself and can answer questions that you have.  Educate yourself about your condition so you can make healthy choices about eating and physical activity.  Check your blood sugar (glucose) levels as often as directed. Your health care provider will help you decide how often to check your blood glucose level depending on your treatment goals and how well you are meeting them.  Ask your health care provider if you should take low-dose aspirin daily and what dose is recommended for you. Taking low-dose aspirin daily is recommended to help prevent cardiovascular disease. Do not use nicotine or tobacco Do not use any products that contain nicotine or tobacco, such as cigarettes and e-cigarettes. If you need help quitting, ask your health care provider. Nicotine raises your risk for diabetes problems. If you quit using nicotine:  You will lower your risk for heart attack, stroke, nerve disease, and kidney disease.  Your cholesterol and blood pressure may improve.  Your blood circulation will improve. Keep your blood pressure under control Your personal target blood pressure is determined based on:  Your age.  Your medicines.  How long you have had diabetes.  Any other medical conditions you have. To control your blood pressure:  Follow instructions from your health care provider about meal planning, exercise, and medicines.  Make sure your health care provider  checks your blood pressure at every medical visit.  Monitor your blood pressure at home as told by your health care provider.  Keep your cholesterol under control To control your cholesterol:  Follow instructions from your health care provider about meal planning, exercise, and medicines.  Have your cholesterol checked at least once a year.  You may be prescribed medicine to lower cholesterol (statin). If you are not taking a statin, ask your health care provider if you should be. Controlling your cholesterol may:  Help prevent heart disease and stroke. These are the most common health problems for people with diabetes.  Improve your blood flow. Schedule and keep yearly physical exams and eye exams Your health care provider will tell you how often you need medical visits depending on your diabetes management plan. Keep all follow-up visits as directed. This is important so possible problems can be identified early and complications can be avoided or treated.  Every visit with your health care provider should include measuring your: ? Weight. ? Blood pressure. ? Blood glucose control.  Your A1c (hemoglobin A1c) level should be checked: ? At least 2 times a year, if you are meeting your treatment goals. ? 4 times a year, if you are not meeting treatment goals or if your treatment goals have changed.  Your blood lipids (lipid profile) should be checked yearly. You should also be checked yearly for protein in your urine (urine microalbumin).  If you have type 1 diabetes, get an eye exam 3-5 years after you are diagnosed, and then once a year after your first exam.  If you have type 2 diabetes, get an eye exam as soon as you  are diagnosed, and then once a year after your first exam. Keep your vaccines current It is recommended that you receive:  A flu (influenza) vaccine every year.  A pneumonia (pneumococcal) vaccine and a hepatitis B vaccine. If you are age 46 or older, you may  get the pneumonia vaccine as a series of two separate shots. Ask your health care provider which other vaccines may be recommended. Take care of your feet Diabetes may cause you to have poor blood circulation to your legs and feet. Because of this, taking care of your feet is very important. Diabetes can cause:  The skin on the feet to get thinner, break more easily, and heal more slowly.  Nerve damage in your legs and feet, which results in decreased feeling. You may not notice minor injuries that could lead to serious problems. To avoid foot problems:  Check your skin and feet every day for cuts, bruises, redness, blisters, or sores.  Schedule a foot exam with your health care provider once every year. This exam includes: ? Inspecting of the structure and skin of your feet. ? Checking the pulses and sensation in your feet.  Make sure that your health care provider performs a visual foot exam at every medical visit.  Take care of your teeth People with poorly controlled diabetes are more likely to have gum (periodontal) disease. Diabetes can make periodontal diseases harder to control. If not treated, periodontal diseases can lead to tooth loss. To prevent this:  Brush your teeth twice a day.  Floss at least once a day.  Visit your dentist 2 times a year. Drink responsibly Limit alcohol intake to no more than 1 drink a day for nonpregnant women and 2 drinks a day for men. One drink equals 12 oz of beer, 5 oz of wine, or 1 oz of hard liquor.  It is important to eat food when you drink alcohol to avoid low blood glucose (hypoglycemia). Avoid alcohol if you:  Have a history of alcohol abuse or dependence.  Are pregnant.  Have liver disease, pancreatitis, advanced neuropathy, or severe hypertriglyceridemia. Lessen stress Living with diabetes can be stressful. When you are experiencing stress, your blood glucose may be affected in two ways:  Stress hormones may cause your blood  glucose to rise.  You may be distracted from taking good care of yourself. Be aware of your stress level and make changes to help you manage challenging situations. To lower your stress levels:  Consider joining a support group.  Do planned relaxation or meditation.  Do a hobby that you enjoy.  Maintain healthy relationships.  Exercise regularly.  Work with your health care provider or a mental health professional. Summary  You can take action to prevent or slow down problems that are caused by diabetes (diabetes mellitus). Following your diabetes plan and taking care of yourself can reduce your risk of serious or life-threatening complications.  Follow instructions from your health care providers about managing your diabetes. Your diabetes may be managed by a team of health care providers who can teach you how to care for yourself and can answer questions that you have.  Your health care provider will tell you how often you need medical visits depending on your diabetes management plan. Keep all follow-up visits as directed. This is important so possible problems can be identified early and complications can be avoided or treated. This information is not intended to replace advice given to you by your health care provider. Make sure  you discuss any questions you have with your health care provider. Document Released: 12/18/2010 Document Revised: 11/19/2016 Document Reviewed: 12/30/2015 Elsevier Interactive Patient Education  2019 [place].  Type 1 Diabetes Mellitus, Self Care, Adult Caring for yourself after you have been diagnosed with type 1 diabetes (type 1 diabetes mellitus) means keeping your blood sugar (glucose) under control. You can do that with a balance of:  Insulin.  Nutrition.  Exercise.  Lifestyle changes.  Other medicines, if necessary.  Support from your team of health care providers and others. The following information explains what you need to know to  manage your diabetes at home. What are the risks? Having diabetes can put you at risk for other long-term (chronic) conditions, such as heart disease and kidney disease. Along with insulin, your health care provider may prescribe medicines to help prevent complications from diabetes. These medicines may include:  Aspirin.  Medicine to lower cholesterol.  Medicine to control blood pressure. How to monitor blood glucose   Check your blood glucose every day, as often as told by your health care provider.  Have your A1c (hemoglobin A1c) level checked two or more times a year, or as often as told by your health care provider. Your health care provider will set individualized treatment goals for you. Generally, the goal of treatment is to maintain the following blood glucose levels:  Before meals (preprandial): 80-130 mg/dL (4.4-7.2 mmol/L).  After meals (postprandial): below 180 mg/dL (10 mmol/L).  A1c level: less than 7%. How to manage hyperglycemia and hypoglycemia Hyperglycemia symptoms Hyperglycemia, also called high blood glucose, occurs when blood glucose is too high. Make sure you know the early signs of hyperglycemia, such as:  Increased thirst.  Hunger.  Feeling very tired.  Needing to urinate more often than usual.  Blurry vision. Hypoglycemia symptoms Hypoglycemia, also called low blood glucose, occurs with a blood glucose level at or below 70 mg/dL (3.9 mmol/L). The risk for hypoglycemia increases during or after exercise, during sleep, during illness, and when skipping meals or not eating for a long time (fasting). Symptoms may include:  Hunger.  Anxiety.  Sweating and feeling clammy.  Confusion.  Dizziness or feeling light-headed.  Sleepiness.  Nausea.  Increased heart rate.  Headache.  Blurry vision.  Irritability.  Tingling or numbness around the mouth, lips, or tongue.  A change in coordination.  Restless sleep.  Fainting.  Seizure. It  is important to know the symptoms of hypoglycemia and treat it right away. Always have a 15-gram rapid-acting carbohydrate snack with you to treat low blood glucose. Family members and close friends should also know the symptoms and should understand how to treat hypoglycemia, in case you are not able to treat yourself. Treating hypoglycemia If you are alert and able to swallow safely, follow the 15:15 rule:  Take 15 grams of a rapid-acting carbohydrate. Talk with your health care provider about how much you should take.  Rapid-acting options include: ? Glucose pills (take 15 grams). ? 6-8 pieces of hard candy. ? 4-6 oz (120-150 mL) of fruit juice. ? 4-6 oz (120-150 mL) of regular (not diet) soda. ? Honey or sugar (1 Tbsp).  Check your blood glucose 15 minutes after you take the carbohydrate.  If the repeat blood glucose level is still at or below 70 mg/dL (3.9 mmol/L), take 15 grams of a carbohydrate again.  If your blood glucose level does not increase above 70 mg/dL (3.9 mmol/L) after 3 tries, seek emergency medical care.  After  your blood glucose returns to normal, eat a meal or a snack within 1 hour. Treating severe hypoglycemia Severe hypoglycemia is when your blood glucose level is at or below 54 mg/dL (3 mmol/L). Severe hypoglycemia is an emergency. Do not wait to see if the symptoms will go away. Get medical help right away. Call your local emergency services (911 in the [place] If you have severe hypoglycemia, and you cannot eat or drink, you may need an injection of glucagon. A family member or close friend should learn how to check your blood glucose and how to give you a glucagon injection. Ask your health care provider if you need to have an emergency glucagon injection kit available. Severe hypoglycemia may need to be treated in a hospital. The treatment may include getting glucose through an IV. You may also need treatment for the cause of your hypoglycemia. Follow these  instructions at home: Take diabetes medicines as told  Take your insulin and other diabetes medicines every day as told.  Do not run out of insulin or other diabetes medicines that you take. Plan ahead so you always have these available.  Adjust your insulin dosage based on how physically active you are and what foods you eat. Your health care provider will tell you how to adjust your dosage. Make healthy food choices  The things that you eat and drink affect your blood glucose and your insulin dosage. Making good choices helps to control your diabetes and prevent other health problems. A healthy meal plan includes eating lean proteins, complex carbohydrates, fresh fruits and vegetables, low-fat dairy products, and healthy fats. Make an appointment to see a diet and nutrition specialist (registered dietitian) to help you create an eating plan that is right for you. Make sure that you:  Follow instructions from your health care provider about eating or drinking restrictions.  Drink enough fluid to keep your urine pale yellow.  Keep a record of the carbohydrates that you eat. Do this by reading food labels and learning the standard serving sizes of foods.  Follow your sick day plan whenever you cannot eat or drink as usual. Make this plan in advance with your health care provider.  Stay active Exercise regularly, as told by your health care provider. This may include:  Stretching and doing strength exercises, such as yoga or weightlifting, 2 or more times a week.  Doing 150 minutes or more of moderate-intensity or vigorous-intensity exercise each week. This could be brisk walking, biking, or water aerobics. ? Spread out your activity over 3 or more days of the week. ? Do not go more than 2 days in a row without doing some kind of physical activity. When you start a new exercise or activity, work with your health care provider to adjust your insulin or food intake as needed. Make healthy  lifestyle choices  Do not use any tobacco products, such as cigarettes, chewing tobacco, and e-cigarettes. If you need help quitting, ask your health care provider.  If your health care provider says that alcohol is safe for you, limit alcohol intake to no more than 1 drink per day for nonpregnant women and 2 drinks per day for men. One drink equals 12 oz of beer, 5 oz of wine, or 1 oz of hard liquor.  Learn to manage stress. If you need help with this, ask your health care provider. Care for your body   Keep your immunizations up to date. In addition to getting vaccinations as told by  your health care provider, it is recommended that you get vaccinated against the following illnesses: ? The flu (influenza). Get a flu shot every year. ? Pneumonia. ? Hepatitis B.  Schedule an eye exam within 3-5 years after your diagnosis, and then one time every year after that.  Check your skin and feet every day for cuts, bruises, redness, blisters, or sores. Schedule a foot exam with your health care provider 3-5 years after your diagnosis, and then every year after the first exam.  Brush your teeth and gums two times a day, and floss one or more times a day. Visit your dentist one or more times every 6 months.  Maintain a healthy weight. General instructions  Take over-the-counter and prescription medicines only as told by your health care provider.  Share your diabetes management plan with people in your workplace, school, and household.  Check your urine for ketones when you are ill and as told by your health care provider.  Carry a medical alert card or wear medical alert jewelry.  Keep all follow-up visits as told by your health care provider. This is important. Questions to ask your health care provider  Do I need to meet with a diabetes educator?  Where can I find a support group for people with diabetes? Where to find more information For more information about diabetes,  visit:  American Diabetes Association (ADA): www.diabetes.org  American Association of Diabetes Educators (AADE): www.diabeteseducator.org Summary  Caring for yourself after you have been diagnosed with type 1 diabetes (type 1 diabetes mellitus) means keeping your blood sugar (glucose) under control. You can do that with a balance of insulin and other medicines, nutrition, exercise, and lifestyle changes.  Check your blood glucose every day, as often as told by your health care provider.  Along with insulin, your health care provider may prescribe medicines to help prevent complications from diabetes.  Keep all follow-up visits as told by your health care provider. This is important. This information is not intended to replace advice given to you by your health care provider. Make sure you discuss any questions you have with your health care provider. Document Released: 07/24/2015 Document Revised: 09/22/2017 Document Reviewed: 05/05/2015 Elsevier Interactive Patient Education  2019 [employer]  Chronic Kidney Disease, Adult Chronic kidney disease (CKD) occurs when the kidneys become damaged slowly over a long period of time. The kidneys are a pair of organs that do many important jobs in the body, including:  Removing waste and extra fluid from the blood to make urine.  Making hormones that maintain the amount of fluid in tissues and blood vessels.  Maintaining the right amount of fluids and chemicals in the body. A small amount of kidney damage may not cause problems, but a large amount of damage may make it hard or impossible for the kidneys to work the way they should. If steps are not taken to slow down kidney damage or to stop it from getting worse, the kidneys may stop working permanently (end-stage renal disease or ESRD). Most of the time, CKD does not go away, but it can often be controlled. People who have CKD are usually able to live normal lives. What are the causes? The  most common causes of this condition are diabetes and high blood pressure (hypertension). Other causes include:  Heart and blood vessel (cardiovascular) disease.  Kidney diseases, such as: ? Glomerulonephritis. ? Interstitial nephritis. ? Polycystic kidney disease. ? Renal vascular disease.  Diseases that affect the immune system.  Genetic diseases.  Medicines that damage the kidneys, such as anti-inflammatory medicines.  Being around or being in contact with poisonous (toxic) substances.  A kidney or urinary infection that occurs again and again (recurs).  Vasculitis. This is swelling or inflammation of the blood vessels.  A problem with urine flow that may be caused by: ? Cancer. ? Having kidney stones more than one time. ? An enlarged prostate, in males. What increases the risk? You are more likely to develop this condition if you:  Are older than age 50.  Are male.  Are African-American, Hispanic, Asian, [place], or American [place].  Are a current or former smoker.  Are obese.  Have a family history of kidney disease or failure.  Often take medicines that are damaging to the kidneys. What are the signs or symptoms? Symptoms of this condition include:  Swelling (edema) of the face, legs, ankles, or feet.  Tiredness (lethargy) and having less energy.  Nausea or vomiting.  Confusion or trouble concentrating.  Problems with urination, such as: ? Painful or burning feeling during urination. ? Decreased urine production. ? Frequent urination, especially at night. ? Bloody urine.  Muscle twitches and cramps, especially in the legs.  Shortness of breath.  Weakness.  Loss of appetite.  Metallic taste in the mouth.  Trouble sleeping.  Dry, itchy skin.  A low blood count (anemia).  Pale lining of the eyelids and surface of the eye (conjunctiva). Symptoms develop slowly and may not be obvious until the kidney damage becomes severe. It is  possible to have kidney disease for years without having any symptoms. How is this diagnosed? This condition may be diagnosed based on:  Blood tests.  Urine tests.  Imaging tests, such as an ultrasound or CT scan.  A test in which a sample of tissue is removed from the kidneys to be examined under a microscope (kidney biopsy). These test results will help your health care provider determine how serious the CKD is. How is this treated? There is no cure for most cases of this condition, but treatment usually relieves symptoms and prevents or slows the progression of the disease. Treatment may include:  Making diet changes, which may require you to avoid alcohol, salty foods (sodium), and foods that are high in potassium, calcium, and protein.  Medicines: ? To lower blood pressure. ? To control blood glucose. ? To relieve anemia. ? To relieve swelling. ? To protect your bones. ? To improve the balance of electrolytes in your blood.  Removing toxic waste from the body through types of dialysis, if the kidneys can no longer do their job (kidney failure).  Managing any other conditions that are causing your CKD or making it worse. Follow these instructions at home: Medicines  Take over-the-counter and prescription medicines only as told by your health care provider. The dose of some medicines that you take may need to be adjusted.  Do not take any new medicines unless approved by your health care provider. Many medicines can worsen your kidney damage.  Do not take any vitamin and mineral supplements unless approved by your health care provider. Many nutritional supplements can worsen your kidney damage. General instructions  Follow your prescribed diet as told by your health care provider.  Do not use any products that contain nicotine or tobacco, such as cigarettes and e-cigarettes. If you need help quitting, ask your health care provider.  Monitor and track your blood pressure  at home. Report changes in your blood  pressure as told by your health care provider.  If you are being treated for diabetes, monitor and track your blood sugar (blood glucose) levels as told by your health care provider.  Maintain a healthy weight. If you need help with this, ask your health care provider.  Start or continue an exercise plan. Exercise at least 30 minutes a day, 5 days a week.  Keep your immunizations up to date as told by your health care provider.  Keep all follow-up visits as told by your health care provider. This is important. Where to find more information  American Association of Kidney Patients: BombTimer.gl  National Kidney Foundation: www.kidney.[place]: https://mathis.com/  [facility]: www.lifeoptions.org and www.kidneyschool.org Contact a health care provider if:  Your symptoms get worse.  You develop new symptoms. Get help right away if:  You develop symptoms of ESRD, which include: ? Headaches. ? Numbness in the hands or feet. ? Easy bruising. ? Frequent hiccups. ? Chest pain. ? Shortness of breath. ? Lack of menstruation, in women.  You have a fever.  You have decreased urine production.  You have pain or bleeding when you urinate. Summary  Chronic kidney disease (CKD) occurs when the kidneys become damaged slowly over a long period of time.  The most common causes of this condition are diabetes and high blood pressure (hypertension).  There is no cure for most cases of this condition, but treatment usually relieves symptoms and prevents or slows the progression of the disease. Treatment may include a combination of medicines and lifestyle changes. This information is not intended to replace advice given to you by your health care provider. Make sure you discuss any questions you have with your health care provider. Document Released: 01/09/2008 Document Revised: 05/09/2016 Document Reviewed:  05/09/2016 Elsevier Interactive Patient Education  2019 [employer].

## 2018-04-01 NOTE — Progress Notes (Signed)
Patient presents to clinic today to follow-up on chronic concerns and establish care.  SUBJECTIVE: PMH: Pt is a 47 yo male with pmh sig for HTN, CKD III, CAD s/p 3 v CABG, DM type I.  Pt previously seen by [facility] in [place].  DM I with stage III kidney disease: -has a Dexacom continuous monitor -needs refills -had insulin pump in past, now using Novolog and Basaglar -Seeing endocrinology in [place], Dr. Wendi Snipes.  Needs a referral -States last hemoglobin A1c was 6.6% last week -endorses h/o retinopathy.  Had recent eye exam -did not realize what his kidney function was/what that meant. -In the past was seen in nephrology twice a year  CAD: -s/p 3v CABG in 2017 -Has appointment on Friday with cardiology, Dr. Kerin Ransom -Lipitor 80 mg, Norvasc 5 mg, Lopressor 25 mg, lisinopril 10 mg.  HTN: -Taking Norvasc 5 mg, Lopressor 25 mg, lisinopril 10 mg -At times checks BP -Systolic was 924-268  Hyperkalemia: -Noted during patient's recent hospitalization -admitted for observation of chest pain on 03/23/2018 -Was 5.2, came down to 5.1  Allergies: NKDA  Past surgical history: Appendectomy 1985 Triple bypass 2017 L eye Vitrectomy yesterday, 03/31/2018  Social history: Patient is single.  He currently is employed as a Nutritional therapist.  Patient denies alcohol, tobacco, drug use.  Health Maintenance: Vision --Dr. Delman Cheadle Immunizations --influenza vaccine 2019  Family medical history: Mom-celiac disease, memory concerns, tobacco use Dad-hearing loss, MI, heart disease, HTN MGM-Alzheimer's PGM-glaucoma and other eye issues Maternal aunt-Alzheimer's  Past Medical History:  Diagnosis Date  . Coronary artery disease   . Diabetes mellitus without complication ([place])   . Myocardial infarct [facility])     Past Surgical History:  Procedure Laterality Date  . CORONARY ARTERY BYPASS GRAFT      Current Outpatient Medications on File Prior to Visit    Medication Sig Dispense Refill  . acetaminophen (TYLENOL) 325 MG tablet Take 2 tablets (650 mg total) by mouth every 4 (four) hours as needed for mild pain, moderate pain, fever or headache. 30 tablet 0  . amLODipine (NORVASC) 5 MG tablet Take 5 mg by mouth daily.  1  . aspirin EC 81 MG tablet Take 1 tablet (81 mg total) by mouth daily. With Food 30 tablet 11  . atorvastatin (LIPITOR) 80 MG tablet Take 80 mg by mouth every evening.  2  . furosemide (LASIX) 20 MG tablet Take 20 mg by mouth daily.    . Insulin Glargine (BASAGLAR KWIKPEN) 100 UNIT/ML SOPN Inject 12 Units into the skin daily.  1  . lisinopril (PRINIVIL,ZESTRIL) 10 MG tablet Take 10 mg by mouth daily.  2  . metoprolol tartrate (LOPRESSOR) 25 MG tablet Take 25 mg by mouth every 8 (eight) hours.  3  . Multiple Vitamin (MULTIVITAMIN WITH MINERALS) TABS tablet Take 1 tablet by mouth daily.    [name] NOVOLOG FLEXPEN 100 UNIT/ML FlexPen Inject 8-20 Units as directed 4 (four) times daily.  0  . OFLOXACIN OT Apply to eye 4 (four) times daily.    [name] PREDNISOLONE ACETATE OP Apply to eye 4 (four) times daily.     No current facility-administered medications on file prior to visit.     No Known Allergies  Family History  Problem Relation Age of Onset  . CAD Father   . Hypertension Father   . Hypertension Other   . Cancer Neg Hx   . Diabetes Neg Hx   . Stroke Neg Hx  Social History   Socioeconomic History  . Marital status: Single    Spouse name: Not on file  . Number of children: Not on file  . Years of education: Not on file  . Highest education level: Not on file  Occupational History  . Not on file  Social Needs  . Financial resource strain: Not on file  . Food insecurity:    Worry: Not on file    Inability: Not on file  . Transportation needs:    Medical: Not on file    Non-medical: Not on file  Tobacco Use  . Smoking status: Never Smoker  . Smokeless tobacco: Never Used  Substance and Sexual Activity  . Alcohol  use: Not on file  . Drug use: Not on file  . Sexual activity: Not on file  Lifestyle  . Physical activity:    Days per week: Not on file    Minutes per session: Not on file  . Stress: Not on file  Relationships  . Social connections:    Talks on phone: Not on file    Gets together: Not on file    Attends religious service: Not on file    Active member of club or organization: Not on file    Attends meetings of clubs or organizations: Not on file    Relationship status: Not on file  . Intimate partner violence:    Fear of current or ex partner: Not on file    Emotionally abused: Not on file    Physically abused: Not on file    Forced sexual activity: Not on file  Other Topics Concern  . Not on file  Social History Narrative  . Not on file    ROS General: Denies fever, chills, night sweats, changes in weight, changes in appetite HEENT: Denies headaches, ear pain, changes in vision, rhinorrhea, sore throat CV: Denies CP, palpitations, SOB, orthopnea Pulm: Denies SOB, cough, wheezing GI: Denies abdominal pain, nausea, vomiting, diarrhea, constipation GU: Denies dysuria, hematuria, frequency, vaginal discharge Msk: Denies muscle cramps, joint pains Neuro: Denies weakness, numbness, tingling Skin: Denies rashes, bruising Psych: Denies depression, anxiety, hallucinations   BP 126/68 (BP Location: Left Arm, Patient Position: Sitting, Cuff Size: Normal)   Pulse 66   Temp 97.8 F (36.6 C) (Oral)   Ht 5\' 7"  (1.702 m)   Wt 126 lb (57.2 kg)   SpO2 98%   BMI 19.73 kg/m   Physical Exam Gen. Pleasant, well developed, thin appearing, in NAD HEENT - [place]/AT, ecchymosis underneath L eye with subconjunctival hemorrhage in left eye, PERRL, no scleral icterus, no nasal drainage, pharynx without erythema or exudate. Neck: No JVD, no thyromegaly, no carotid bruits Lungs: no use of accessory muscles, no dullness to percussion, CTAB, no wheezes, rales or rhonchi Cardiovascular: RRR, No  r/g/m, no peripheral edema Neuro:  A&Ox3, CN II-XII intact, normal gait Skin:  Warm, dry, intact, no lesions  Recent Results (from the past 2160 hour(s))  Basic metabolic panel     Status: Abnormal   Collection Time: 03/23/18  4:10 PM  Result Value Ref Range   Sodium 138 135 - 145 mmol/L   Potassium 5.2 (H) 3.5 - 5.1 mmol/L   Chloride 105 98 - 111 mmol/L   CO2 21 (L) 22 - 32 mmol/L   Glucose, Bld 113 (H) 70 - 99 mg/dL   BUN 66 (H) 6 - 20 mg/dL   Creatinine, Ser 2.97 (H) 0.61 - 1.24 mg/dL   Calcium 9.2 8.9 -  10.3 mg/dL   GFR calc non Af Amer 24 (L) >60 mL/min   GFR calc Af Amer 28 (L) >60 mL/min   Anion gap 12 5 - 15    Comment: Performed at [facility] [address] [place] [address]  CBC     Status: Abnormal   Collection Time: 03/23/18  4:10 PM  Result Value Ref Range   WBC 6.8 4.0 - 10.5 K/uL   RBC 3.75 (L) 4.22 - 5.81 MIL/uL   Hemoglobin 11.4 (L) 13.0 - 17.0 g/dL   HCT 35.0 (L) 39.0 - 52.0 %   MCV 93.3 80.0 - 100.0 fL   MCH 30.4 26.0 - 34.0 pg   MCHC 32.6 30.0 - 36.0 g/dL   RDW 11.8 11.5 - 15.5 %   Platelets 206 150 - 400 K/uL   nRBC 0.0 0.0 - 0.2 %    Comment: Performed at [facility], [place] [address] [place], [place] [address]  I-stat troponin, ED     Status: None   Collection Time: 03/23/18  4:11 PM  Result Value Ref Range   Troponin i, poc 0.00 0.00 - 0.08 ng/mL   Comment 3            Comment: Due to the release kinetics of cTnI, a negative result within the first hours of the onset of symptoms does not rule out myocardial infarction with certainty. If myocardial infarction is still suspected, repeat the test at appropriate intervals.   Troponin I - Now Then Q3H     Status: None   Collection Time: 03/23/18  7:58 PM  Result Value Ref Range   Troponin I <0.03 <0.03 ng/mL    Comment: Performed at [facility] [address] [place], [place] [address]  D-dimer, quantitative (not at [facility])     Status: None   Collection Time:  03/23/18  7:58 PM  Result Value Ref Range   D-Dimer, Quant 0.27 0.00 - 0.50 ug/mL-FEU    Comment: (NOTE) At the manufacturer cut-off of 0.50 ug/mL FEU, this assay has been documented to exclude PE with a sensitivity and negative predictive value of 97 to 99%.  At this time, this assay has not been approved by the FDA to exclude DVT/VTE. Results should be correlated with clinical presentation. Performed at [facility], [place] [address] [place], [place] [address]   I-stat troponin, ED     Status: None   Collection Time: 03/23/18  8:08 PM  Result Value Ref Range   Troponin i, poc 0.01 0.00 - 0.08 ng/mL   Comment 3            Comment: Due to the release kinetics of cTnI, a negative result within the first hours of the onset of symptoms does not rule out myocardial infarction with certainty. If myocardial infarction is still suspected, repeat the test at appropriate intervals.   CBG monitoring, ED     Status: Abnormal   Collection Time: 03/23/18 10:28 PM  Result Value Ref Range   Glucose-Capillary 203 (H) 70 - 99 mg/dL  Troponin I - Now Then Q3H     Status: None   Collection Time: 03/24/18 12:21 AM  Result Value Ref Range   Troponin I <0.03 <0.03 ng/mL    Comment: Performed at [facility], [place] [address] [place], [place] [address]  Hemoglobin A1c     Status: Abnormal   Collection Time: 03/24/18 12:21 AM  Result Value  Ref Range   Hgb A1c MFr Bld 6.6 (H) 4.8 - 5.6 %    Comment: (NOTE) Pre diabetes:          5.7%-6.4% Diabetes:              >6.4% Glycemic control for   <7.0% adults with diabetes    Mean Plasma Glucose 142.72 mg/dL    Comment: Performed at [facility] [address] [place], [place] [address]  HIV antibody (Routine Testing)     Status: None   Collection Time: 03/24/18 12:21 AM  Result Value Ref Range   HIV Screen 4th Generation wRfx Non Reactive Non Reactive    Comment: (NOTE) Performed At: [facility] [place], [place] [id number] Rush Farmer MD UK:[id number]   Glucose, capillary     Status: Abnormal   Collection Time: 03/24/18 12:26 AM  Result Value Ref Range   Glucose-Capillary 152 (H) 70 - 99 mg/dL  Glucose, capillary     Status: Abnormal   Collection Time: 03/24/18  3:38 AM  Result Value Ref Range   Glucose-Capillary 65 (L) 70 - 99 mg/dL  Troponin I - Now Then Q3H     Status: None   Collection Time: 03/24/18  4:30 AM  Result Value Ref Range   Troponin I <0.03 <0.03 ng/mL    Comment: Performed at [facility], [place] [address], [place], [place] [address]  Glucose, capillary     Status: None   Collection Time: 03/24/18  8:03 AM  Result Value Ref Range   Glucose-Capillary 83 70 - 99 mg/dL  ECHOCARDIOGRAM COMPLETE     Status: None   Collection Time: 03/24/18 10:06 AM  Result Value Ref Range   Weight 1,964.8 oz   Height 67 in   BP 140/84 mmHg  Glucose, capillary     Status: Abnormal   Collection Time: 03/24/18 10:18 AM  Result Value Ref Range   Glucose-Capillary 48 (L) 70 - 99 mg/dL  Glucose, capillary     Status: Abnormal   Collection Time: 03/24/18 10:45 AM  Result Value Ref Range   Glucose-Capillary 134 (H) 70 - 99 mg/dL  Glucose, capillary     Status: Abnormal   Collection Time: 03/24/18  1:43 PM  Result Value Ref Range   Glucose-Capillary 174 (H) 70 - 99 mg/dL  NM Myocar Multi W/Spect W/Wall Motion / EF     Status: None   Collection Time: 03/24/18  1:56 PM  Result Value Ref Range   Rest HR 64 bpm   Rest BP 151/53 mmHg   Percent HR 48 %   Estimated workload 1.0 METS   Peak HR 83 bpm   Peak BP 173/82 mmHg   MPHR 172 bpm    Assessment/Plan: Essential hypertension  -Controlled -Continue lifestyle modifications -Continue current medications - Plan: Ambulatory referral to Nephrology  Coronary artery disease involving native coronary artery of native heart without angina pectoris -Continue current medications including Norvasc 5 mg, lisinopril 10 mg,  atorvastatin 80 mg, Lasix 20 mg -Patient advised to keep appointment with cardiology on Friday  Type 1 diabetes mellitus with stage 3 chronic kidney disease ([place])  -Continue current regimen NovoLog and basaglar -Rx for Dexcom sensors given -Referrals placed for Endo and Nephrology - Plan: Ambulatory referral to Endocrinology, Ambulatory referral to Nephrology, Continuous Blood Gluc Sensor (DEXCOM G6 SENSOR) MISC  Hyperkalemia -We will repeat labs - Plan: Basic metabolic panel  Encounter to establish care -We reviewed the  PMH, PSH, FH, SH, Meds and Allergies. -We provided refills for any medications we will prescribe as needed. -We addressed current concerns per orders and patient instructions. -We have asked for records for pertinent exams, studies, vaccines and notes from previous providers. -We have advised patient to follow up per instructions below.  Diabetic retinopathy associated with type 1 diabetes mellitus, macular edema presence unspecified, unspecified laterality, unspecified retinopathy severity ([place]) -Continue following with ophthalmology -Seen yesterday for vitrectomy, left eye  Follow-up in the next few months, sooner if needed  Grier Mitts, MD

## 2018-04-03 ENCOUNTER — Ambulatory Visit (INDEPENDENT_AMBULATORY_CARE_PROVIDER_SITE_OTHER): Payer: Managed Care, Other (non HMO) | Admitting: Cardiology

## 2018-04-03 ENCOUNTER — Encounter: Payer: Self-pay | Admitting: Cardiology

## 2018-04-03 VITALS — BP 128/70 | HR 62 | Ht 67.0 in | Wt 128.8 lb

## 2018-04-03 DIAGNOSIS — Z951 Presence of aortocoronary bypass graft: Secondary | ICD-10-CM | POA: Diagnosis not present

## 2018-04-03 DIAGNOSIS — Z794 Long term (current) use of insulin: Secondary | ICD-10-CM

## 2018-04-03 DIAGNOSIS — N183 Chronic kidney disease, stage 3 unspecified: Secondary | ICD-10-CM

## 2018-04-03 DIAGNOSIS — I10 Essential (primary) hypertension: Secondary | ICD-10-CM | POA: Diagnosis not present

## 2018-04-03 DIAGNOSIS — E118 Type 2 diabetes mellitus with unspecified complications: Secondary | ICD-10-CM

## 2018-04-03 DIAGNOSIS — R079 Chest pain, unspecified: Secondary | ICD-10-CM

## 2018-04-03 DIAGNOSIS — IMO0001 Reserved for inherently not codable concepts without codable children: Secondary | ICD-10-CM

## 2018-04-03 NOTE — Assessment & Plan Note (Signed)
Admitted 03/23/18 with chest pain- MI r/o Myoview low risk.

## 2018-04-03 NOTE — Assessment & Plan Note (Signed)
Controlled.  

## 2018-04-03 NOTE — Patient Instructions (Signed)
Medication Instructions:  Your physician recommends that you continue on your current medications as directed. Please refer to the Current Medication list given to you today.  If you need a refill on your cardiac medications before your next appointment, please call your pharmacy.   Lab work: None  If you have labs (blood work) drawn today and your tests are completely normal, you will receive your results only by: [name] MyChart Message (if you have MyChart) OR . A paper copy in the mail If you have any lab test that is abnormal or we need to change your treatment, we will call you to review the results.  Testing/Procedures: None   Follow-Up: At [facility], you and your health needs are our priority.  As part of our continuing mission to provide you with exceptional heart care, we have created designated Provider Care Teams.  These Care Teams include your primary Cardiologist (physician) and Advanced Practice Providers (APPs -  Physician Assistants and Nurse Practitioners) who all work together to provide you with the care you need, when you need it. You will need a follow up appointment in 6 months. Please call our office 2 months (APRIL 2020) in advance to schedule this appointment. You may see Dr Gwenlyn Found or one of the following Advanced Practice Providers on your designated Care Team:   Kerin Ransom, PA-C Roby Lofts, [place] . Sande Rives, PA-C  Any Other Special Instructions Will Be Listed Below (If Applicable).

## 2018-04-03 NOTE — Assessment & Plan Note (Signed)
He is to be established with [facility]

## 2018-04-03 NOTE — Progress Notes (Signed)
04/03/2018 Willie Fletcher   1970/07/12  [id number]  Primary Physician Billie Ruddy, MD Primary Cardiologist: Dr Gwenlyn Found (new)  HPI: There is an office note on [place].  The patient is a pleasant 47 year old male with a history of type 1 insulin-dependent diabetes, coronary disease, status post CABG x3 in 2017 in [place], chronic renal insufficiency stage IV, hypertension, and dyslipidemia.  The patient had bypass grafting x3 with an LIMA to the LAD, and SVG to the diagonal, and SVG to the OM.  He was followed by cardiology in [place] until he moved here in May of this year.  He has been compliant with his medications.  He presented to the emergency room at [facility] with substernal chest pain on 03/23/2018.  He was seen in consult by Dr. Harl Bowie.  The patient had a Myoview study which was low risk.  His troponins were negative.  It was felt he could be discharged and he is in the office today for follow-up.  Since discharge she is done well.  He does have retinopathy and had a vitrectomy in his left eye earlier this week.  His primary care provider is going to arrange for him to get established with a nephrologist.   Current Outpatient Medications  Medication Sig Dispense Refill  . acetaminophen (TYLENOL) 325 MG tablet Take 2 tablets (650 mg total) by mouth every 4 (four) hours as needed for mild pain, moderate pain, fever or headache. 30 tablet 0  . amLODipine (NORVASC) 5 MG tablet Take 5 mg by mouth daily.  1  . aspirin EC 81 MG tablet Take 1 tablet (81 mg total) by mouth daily. With Food 30 tablet 11  . atorvastatin (LIPITOR) 80 MG tablet Take 80 mg by mouth every evening.  2  . Continuous Blood Gluc Sensor (DEXCOM G6 SENSOR) MISC 1 each by Does not apply route as directed. 6 each 2  . furosemide (LASIX) 20 MG tablet Take 20 mg by mouth daily.    . Insulin Glargine (BASAGLAR KWIKPEN) 100 UNIT/ML SOPN Inject 12 Units into the skin daily.  1  . lisinopril  (PRINIVIL,ZESTRIL) 10 MG tablet Take 10 mg by mouth daily.  2  . metoprolol tartrate (LOPRESSOR) 25 MG tablet Take 25 mg by mouth every 8 (eight) hours.  3  . Multiple Vitamin (MULTIVITAMIN WITH MINERALS) TABS tablet Take 1 tablet by mouth daily.    [name] NOVOLOG FLEXPEN 100 UNIT/ML FlexPen Inject 8-20 Units as directed 4 (four) times daily.  0  . OFLOXACIN OT Apply to eye 4 (four) times daily.    [name] PREDNISOLONE ACETATE OP Apply to eye 4 (four) times daily.     No current facility-administered medications for this visit.     No Known Allergies  Past Medical History:  Diagnosis Date  . Coronary artery disease   . Diabetes mellitus without complication ([place])   . Myocardial infarct [facility])     Social History   Socioeconomic History  . Marital status: Single    Spouse name: Not on file  . Number of children: Not on file  . Years of education: Not on file  . Highest education level: Not on file  Occupational History  . Not on file  Social Needs  . Financial resource strain: Not on file  . Food insecurity:    Worry: Not on file    Inability: Not on file  . Transportation needs:    Medical: Not on file  Non-medical: Not on file  Tobacco Use  . Smoking status: Never Smoker  . Smokeless tobacco: Never Used  Substance and Sexual Activity  . Alcohol use: Not on file  . Drug use: Not on file  . Sexual activity: Not on file  Lifestyle  . Physical activity:    Days per week: Not on file    Minutes per session: Not on file  . Stress: Not on file  Relationships  . Social connections:    Talks on phone: Not on file    Gets together: Not on file    Attends religious service: Not on file    Active member of club or organization: Not on file    Attends meetings of clubs or organizations: Not on file    Relationship status: Not on file  . Intimate partner violence:    Fear of current or ex partner: Not on file    Emotionally abused: Not on file    Physically abused: Not on file     Forced sexual activity: Not on file  Other Topics Concern  . Not on file  Social History Narrative  . Not on file     Family History  Problem Relation Age of Onset  . CAD Father   . Hypertension Father   . Hypertension Other   . Cancer Neg Hx   . Diabetes Neg Hx   . Stroke Neg Hx      Review of Systems: General: negative for chills, fever, night sweats or weight changes.  Cardiovascular: negative for chest pain, dyspnea on exertion, edema, orthopnea, palpitations, paroxysmal nocturnal dyspnea or shortness of breath Dermatological: negative for rash Respiratory: negative for cough or wheezing Urologic: negative for hematuria Abdominal: negative for nausea, vomiting, diarrhea, bright red blood per rectum, melena, or hematemesis Neurologic: negative for visual changes, syncope, or dizziness All other systems reviewed and are otherwise negative except as noted above.    Blood pressure 128/70, pulse 62, height 5\' 7"  (1.702 m), weight 128 lb 12.8 oz (58.4 kg).  General appearance: alert, cooperative, no distress and thin Neck: no carotid bruit and no JVD Lungs: clear to auscultation bilaterally Heart: regular rate and rhythm Extremities: no edema Skin: Lt orbit ecchymosis Neurologic: Grossly normal  EKG 03/24/18- NSR 70  ASSESSMENT AND PLAN:   Chest pain Admitted 03/23/18 with chest pain- MI r/o Myoview low risk.  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 He'll need fasting lipids with his next labs, will defer to his PCP. We will see in 6 months or sooner if has any further chest pain issues.   Kerin Ransom PA-C 04/03/2018 1:40 PM

## 2018-04-03 NOTE — Assessment & Plan Note (Signed)
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

## 2018-04-03 NOTE — Assessment & Plan Note (Signed)
[place] x 3 [place]- [place] LIMA-LAD, SVG-Dx, SVG-OM Myoview low risk dec 2019- EF 45-55%

## 2018-04-06 ENCOUNTER — Encounter: Payer: Self-pay | Admitting: Family Medicine

## 2018-04-09 ENCOUNTER — Other Ambulatory Visit: Payer: Self-pay

## 2018-04-09 ENCOUNTER — Emergency Department (HOSPITAL_COMMUNITY)
Admission: EM | Admit: 2018-04-09 | Discharge: 2018-04-09 | Disposition: A | Discharge status: Home / Self Care | Payer: Managed Care, Other (non HMO) | Attending: Emergency Medicine | Admitting: Emergency Medicine

## 2018-04-09 ENCOUNTER — Emergency Department (HOSPITAL_COMMUNITY): Payer: Managed Care, Other (non HMO)

## 2018-04-09 ENCOUNTER — Encounter (HOSPITAL_COMMUNITY): Payer: Self-pay | Admitting: *Deleted

## 2018-04-09 DIAGNOSIS — Z951 Presence of aortocoronary bypass graft: Secondary | ICD-10-CM | POA: Diagnosis not present

## 2018-04-09 DIAGNOSIS — K0889 Other specified disorders of teeth and supporting structures: Secondary | ICD-10-CM | POA: Diagnosis not present

## 2018-04-09 DIAGNOSIS — N183 Chronic kidney disease, stage 3 (moderate): Secondary | ICD-10-CM | POA: Diagnosis not present

## 2018-04-09 DIAGNOSIS — I129 Hypertensive chronic kidney disease with stage 1 through stage 4 chronic kidney disease, or unspecified chronic kidney disease: Secondary | ICD-10-CM | POA: Diagnosis not present

## 2018-04-09 DIAGNOSIS — R51 Headache: Secondary | ICD-10-CM | POA: Diagnosis not present

## 2018-04-09 DIAGNOSIS — Z79899 Other long term (current) drug therapy: Secondary | ICD-10-CM | POA: Diagnosis not present

## 2018-04-09 DIAGNOSIS — E10319 Type 1 diabetes mellitus with unspecified diabetic retinopathy without macular edema: Secondary | ICD-10-CM | POA: Diagnosis not present

## 2018-04-09 DIAGNOSIS — Z7982 Long term (current) use of aspirin: Secondary | ICD-10-CM | POA: Diagnosis not present

## 2018-04-09 DIAGNOSIS — E1022 Type 1 diabetes mellitus with diabetic chronic kidney disease: Secondary | ICD-10-CM | POA: Diagnosis not present

## 2018-04-09 DIAGNOSIS — R519 Headache, unspecified: Secondary | ICD-10-CM

## 2018-04-09 DIAGNOSIS — I2510 Atherosclerotic heart disease of native coronary artery without angina pectoris: Secondary | ICD-10-CM | POA: Diagnosis not present

## 2018-04-09 DIAGNOSIS — E1065 Type 1 diabetes mellitus with hyperglycemia: Secondary | ICD-10-CM | POA: Insufficient documentation

## 2018-04-09 DIAGNOSIS — R739 Hyperglycemia, unspecified: Secondary | ICD-10-CM

## 2018-04-09 LAB — URINALYSIS, ROUTINE W REFLEX MICROSCOPIC
Bacteria, UA: NONE SEEN
Bilirubin Urine: NEGATIVE
Glucose, UA: >=500 mg/dL — AB
Hgb urine dipstick: NEGATIVE
Ketones, ur: 5 mg/dL — AB
Leukocytes, UA: NEGATIVE
NITRITE: NEGATIVE
PH: 7 (ref 5.0–8.0)
Protein, ur: 30 mg/dL — AB
Specific Gravity, Urine: 1.01 (ref 1.005–1.030)

## 2018-04-09 LAB — CBC WITH DIFFERENTIAL/PLATELET
Abs Immature Granulocytes: 0.01 10*3/uL (ref 0.00–0.07)
BASOS ABS: 0.1 10*3/uL (ref 0.0–0.1)
Basophils Relative: 1 %
Eosinophils Absolute: 0.1 10*3/uL (ref 0.0–0.5)
Eosinophils Relative: 2 %
HEMATOCRIT: 35.7 % — AB (ref 39.0–52.0)
Hemoglobin: 12.2 g/dL — ABNORMAL LOW (ref 13.0–17.0)
Immature Granulocytes: 0 %
Lymphocytes Relative: 11 %
Lymphs Abs: 0.8 10*3/uL (ref 0.7–4.0)
MCH: 30.3 pg (ref 26.0–34.0)
MCHC: 34.2 g/dL (ref 30.0–36.0)
MCV: 88.6 fL (ref 80.0–100.0)
Monocytes Absolute: 0.3 10*3/uL (ref 0.1–1.0)
Monocytes Relative: 4 %
Neutro Abs: 5.9 10*3/uL (ref 1.7–7.7)
Neutrophils Relative %: 82 %
Platelets: 208 10*3/uL (ref 150–400)
RBC: 4.03 MIL/uL — ABNORMAL LOW (ref 4.22–5.81)
RDW: 11.2 % — AB (ref 11.5–15.5)
WBC: 7.2 10*3/uL (ref 4.0–10.5)
nRBC: 0 % (ref 0.0–0.2)

## 2018-04-09 LAB — COMPREHENSIVE METABOLIC PANEL
ALK PHOS: 56 U/L (ref 38–126)
ALT: 57 U/L — ABNORMAL HIGH (ref 0–44)
AST: 46 U/L — ABNORMAL HIGH (ref 15–41)
Albumin: 4.5 g/dL (ref 3.5–5.0)
Anion gap: 17 — ABNORMAL HIGH (ref 5–15)
BUN: 50 mg/dL — ABNORMAL HIGH (ref 6–20)
CALCIUM: 9.6 mg/dL (ref 8.9–10.3)
CO2: 15 mmol/L — ABNORMAL LOW (ref 22–32)
Chloride: 102 mmol/L (ref 98–111)
Creatinine, Ser: 2.8 mg/dL — ABNORMAL HIGH (ref 0.61–1.24)
GFR calc Af Amer: 30 mL/min — ABNORMAL LOW (ref >60)
GFR calc non Af Amer: 26 mL/min — ABNORMAL LOW (ref >60)
Glucose, Bld: 290 mg/dL — ABNORMAL HIGH (ref 70–99)
Potassium: 5.1 mmol/L (ref 3.5–5.1)
Sodium: 134 mmol/L — ABNORMAL LOW (ref 135–145)
TOTAL PROTEIN: 7.4 g/dL (ref 6.5–8.1)
Total Bilirubin: 1.6 mg/dL — ABNORMAL HIGH (ref 0.3–1.2)

## 2018-04-09 LAB — LIPASE, BLOOD: Lipase: 35 U/L (ref 11–51)

## 2018-04-09 LAB — CBG MONITORING, ED: Glucose-Capillary: 288 mg/dL — ABNORMAL HIGH (ref 70–99)

## 2018-04-09 MED ORDER — PENICILLIN V POTASSIUM 500 MG PO TABS: 500.0000 mg | ORAL_TABLET | Freq: Three times a day (TID) | ORAL | 0 refills | Status: DC

## 2018-04-09 MED ORDER — PROCHLORPERAZINE EDISYLATE 10 MG/2ML IJ SOLN
10.0000 mg | Freq: Once | INTRAMUSCULAR | Status: AC
  Administered 2018-04-09: 10 mg via INTRAVENOUS
  Filled 2018-04-09: qty 2

## 2018-04-09 MED ORDER — DIPHENHYDRAMINE HCL 50 MG/ML IJ SOLN
12.5000 mg | Freq: Once | INTRAMUSCULAR | Status: AC
  Administered 2018-04-09: 12.5 mg via INTRAVENOUS
  Filled 2018-04-09: qty 1

## 2018-04-09 MED ORDER — ONDANSETRON HCL 4 MG PO TABS: 4.0000 mg | ORAL_TABLET | Freq: Three times a day (TID) | ORAL | 0 refills | Status: DC | PRN

## 2018-04-09 MED ORDER — SODIUM CHLORIDE 0.9 % IV BOLUS
1000.0000 mL | Freq: Once | INTRAVENOUS | Status: AC
  Administered 2018-04-09: 1000 mL via INTRAVENOUS

## 2018-04-09 NOTE — Discharge Instructions (Signed)
Please take antibiotic and follow up with dentist for further management of your dental pain. Take tylenol for headache.  Monitor your blood sugar closely as it is high today.

## 2018-04-09 NOTE — ED Triage Notes (Signed)
Pt states he had left eye surgery last Tuesday for retinopathy.  PT states that all the sudden at 1000 today he started having a bad headache to left side and rates it a 10/10 and is nauseated. Pt feels this is the worst headache of his life.

## 2018-04-09 NOTE — ED Provider Notes (Signed)
[facility] Provider Note   CSN: [id number] Arrival date & time: 04/09/18  1359     History   Chief Complaint Chief Complaint  Patient presents with  . Headache  . Emesis    HPI Willie Fletcher is a 47 y.o. male with a past medical history of prior MI, type 1 diabetes, who presents to ED for evaluation of gradual onset of left-sided headache that began 4 hours ago.  He had surgery on his left retina on 03/31/18.  States that he has had decreased vision in his eye since then.  He woke up this morning in his usual state of health.  He felt a gradual onset of his left-sided headache, nausea and several episodes of nonbloody, nonbilious emesis.  Denies any injuries or falls.  No history of similar symptoms in the past.  He took a gram of Tylenol with no improvement in his symptoms.  Denies any chest pain, abdominal pain, shortness of breath, neck pain, fever, numbness in arms or legs.  HPI  Past Medical History:  Diagnosis Date  . Coronary artery disease   . Diabetes mellitus without complication ([place])   . Myocardial infarct [facility])     Patient Active Problem List   Diagnosis Date Noted  . Diabetic retinopathy associated with type 1 diabetes mellitus ([place]) 04/01/2018  . Chest pain 03/23/2018  . Hyperkalemia 03/23/2018  . Insulin dependent diabetes mellitus with complications ([place]) 00/17/4944  . Essential hypertension 03/23/2018  . Hx of CABG 03/23/2018  . CKD (chronic kidney disease), stage III ([place]) 03/23/2018    Past Surgical History:  Procedure Laterality Date  . CORONARY ARTERY BYPASS GRAFT    . EYE SURGERY          Home Medications    Prior to Admission medications   Medication Sig Start Date End Date Taking? Authorizing Provider  acetaminophen (TYLENOL) 325 MG tablet Take 2 tablets (650 mg total) by mouth every 4 (four) hours as needed for mild pain, moderate pain, fever or headache. 03/24/18   Denton Brick, Courage, MD    amLODipine (NORVASC) 5 MG tablet Take 5 mg by mouth daily. 02/06/18   [provider]  aspirin EC 81 MG tablet Take 1 tablet (81 mg total) by mouth daily. With Food 03/24/18   Roxan Hockey, MD  atorvastatin (LIPITOR) 80 MG tablet Take 80 mg by mouth every evening. 12/22/17   [provider]  Continuous Blood Gluc Sensor (DEXCOM G6 SENSOR) MISC 1 each by Does not apply route as directed. 04/01/18   Billie Ruddy, MD  furosemide (LASIX) 20 MG tablet Take 20 mg by mouth daily.    [provider]  Insulin Glargine (BASAGLAR KWIKPEN) 100 UNIT/ML SOPN Inject 12 Units into the skin daily. 02/23/18   [provider]  lisinopril (PRINIVIL,ZESTRIL) 10 MG tablet Take 10 mg by mouth daily. 02/06/18   [provider]  metoprolol tartrate (LOPRESSOR) 25 MG tablet Take 25 mg by mouth every 8 (eight) hours. 02/06/18   [provider]  Multiple Vitamin (MULTIVITAMIN WITH MINERALS) TABS tablet Take 1 tablet by mouth daily.    [provider]  NOVOLOG FLEXPEN 100 UNIT/ML FlexPen Inject 8-20 Units as directed 4 (four) times daily. 01/29/18   [provider]  OFLOXACIN OT Apply to eye 4 (four) times daily.    [provider]  PREDNISOLONE ACETATE OP Apply to eye 4 (four) times daily.    [provider]  Family History Family History  Problem Relation Age of Onset  . CAD Father   . Hypertension Father   . Hypertension Other   . Cancer Neg Hx   . Diabetes Neg Hx   . Stroke Neg Hx     Social History Social History   Tobacco Use  . Smoking status: Never Smoker  . Smokeless tobacco: Never Used  Substance Use Topics  . Alcohol use: Never    Frequency: Never  . Drug use: Never     Allergies   Patient has no known allergies.   Review of Systems Review of Systems  Constitutional: Negative for appetite change, chills and fever.  HENT: Negative for ear pain, rhinorrhea, sneezing and sore throat.   Eyes:  Positive for visual disturbance. Negative for photophobia.  Respiratory: Negative for cough, chest tightness, shortness of breath and wheezing.   Cardiovascular: Negative for chest pain and palpitations.  Gastrointestinal: Positive for nausea and vomiting. Negative for abdominal pain, blood in stool, constipation and diarrhea.  Genitourinary: Negative for dysuria, hematuria and urgency.  Musculoskeletal: Negative for myalgias.  Skin: Negative for rash.  Neurological: Positive for headaches. Negative for dizziness, weakness and light-headedness.     Physical Exam Updated Vital Signs BP (!) 183/88   Pulse 85   Temp 98.1 F (36.7 C) (Oral)   Resp (!) 31   SpO2 100%   Physical Exam Vitals signs and nursing note reviewed.  Constitutional:      General: He is not in acute distress.    Appearance: He is well-developed.  HENT:     Head: Normocephalic and atraumatic.     Nose: Nose normal.  Eyes:     General: No scleral icterus.       Right eye: No discharge.        Left eye: Discharge present.    Conjunctiva/sclera: Conjunctivae normal.     Pupils: Pupils are equal, round, and reactive to light.  Neck:     Musculoskeletal: Normal range of motion and neck supple.  Cardiovascular:     Rate and Rhythm: Normal rate and regular rhythm.     Heart sounds: Normal heart sounds. No murmur. No friction rub. No gallop.   Pulmonary:     Effort: Pulmonary effort is normal. No respiratory distress.     Breath sounds: Normal breath sounds.  Abdominal:     General: Bowel sounds are normal. There is no distension.     Palpations: Abdomen is soft.     Tenderness: There is no abdominal tenderness. There is no guarding.  Musculoskeletal: Normal range of motion.  Skin:    General: Skin is warm and dry.     Findings: No rash.  Neurological:     General: No focal deficit present.     Mental Status: He is alert and oriented to person, place, and time.     Cranial Nerves: No cranial nerve deficit.      Sensory: No sensory deficit.     Motor: No weakness or abnormal muscle tone.     Coordination: Coordination normal.      ED Treatments / Results  Labs (all labs ordered are listed, but only abnormal results are displayed) Labs Reviewed  CBC WITH DIFFERENTIAL/PLATELET - Abnormal; Notable for the following components:      Result Value   RBC 4.03 (*)    Hemoglobin 12.2 (*)    HCT 35.7 (*)    RDW 11.2 (*)    All other components within normal  limits  CBG MONITORING, ED - Abnormal; Notable for the following components:   Glucose-Capillary 288 (*)    All other components within normal limits  LIPASE, BLOOD  URINALYSIS, ROUTINE W REFLEX MICROSCOPIC  COMPREHENSIVE METABOLIC PANEL    EKG None  Radiology No results found.  Procedures Procedures (including critical care time)  Medications Ordered in ED Medications  sodium chloride 0.9 % bolus 1,000 mL (1,000 mLs Intravenous New Bag/Given 04/09/18 1504)  prochlorperazine (COMPAZINE) injection 10 mg (10 mg Intravenous Given 04/09/18 1503)  diphenhydrAMINE (BENADRYL) injection 12.5 mg (12.5 mg Intravenous Given 04/09/18 1503)     Initial Impression / Assessment and Plan / ED Course  I have reviewed the triage vital signs and the nursing notes.  Pertinent labs & imaging results that were available during my care of the patient were reviewed by me and considered in my medical decision making (see chart for details).     47 year old male with a past medical history of prior MI, type 1 diabetes presents to ED for gradual onset left-sided headache that began 4 hours ago.  He had surgery on his left retina for retinopathy on 12/17.  He has had decreased vision in his eyes since then.  He has been compliant with his eyedrops.  Woke up this morning in his usual state of health and then gradually developed a headache.  Associated with nausea and emesis.  No injuries or falls.  No improvement with Tylenol.  Denies shortness of breath,  abdominal pain, neck pain or fever.  On my exam patient is retching.  No deficits on neurological exam noted.  No meningismus.  He is afebrile.  He is hypertensive to 173/86.  Plan is to check CBG, other labs and imaging and reassess.  Will be given migraine cocktail, fluids to help with headache and vomiting.  3:43 PM CBG 228. Imaging pending. Care handed off to [place].   Portions of this note were generated with [employer]. Dictation errors may occur despite best attempts at proofreading.   Final Clinical Impressions(s) / ED Diagnoses   Final diagnoses:  None    ED Discharge Orders    None       Delia Heady, PA-C 04/09/18 1553    Margette Fast, MD 04/09/18 2038

## 2018-04-09 NOTE — ED Notes (Signed)
Patient transported to CT 

## 2018-04-09 NOTE — ED Provider Notes (Signed)
Received signout at the beginning of shift.  Patient with history of type 1 diabetes, prior MI presenting complaining of left-sided headache.  He was seen and evaluated and was given a migraine cocktail.  His labs remarkable for hyperglycemia with a CBG of 288 with an anion gap of 17.  Evidence of poor renal function but near baseline, head CT scan without acute changes, chest x-ray unremarkable.  7:28 PM Pt report dental pain for more than a week.  On exam pt does have dental decay to L upper premolar with tenderness to palpation and surrounding inflammatory skin changes and ulceration. No abscess.  Headache did improve.  Will give abx, and dental referral.  Return precaution given.    BP (!) 132/59   Pulse 99   Temp 98.1 F (36.7 C) (Oral)   Resp 14   SpO2 98%   Results for orders placed or performed during the hospital encounter of 04/09/18  CBC with Differential  Result Value Ref Range   WBC 7.2 4.0 - 10.5 K/uL   RBC 4.03 (L) 4.22 - 5.81 MIL/uL   Hemoglobin 12.2 (L) 13.0 - 17.0 g/dL   HCT 35.7 (L) 39.0 - 52.0 %   MCV 88.6 80.0 - 100.0 fL   MCH 30.3 26.0 - 34.0 pg   MCHC 34.2 30.0 - 36.0 g/dL   RDW 11.2 (L) 11.5 - 15.5 %   Platelets 208 150 - 400 K/uL   nRBC 0.0 0.0 - 0.2 %   Neutrophils Relative % 82 %   Neutro Abs 5.9 1.7 - 7.7 K/uL   Lymphocytes Relative 11 %   Lymphs Abs 0.8 0.7 - 4.0 K/uL   Monocytes Relative 4 %   Monocytes Absolute 0.3 0.1 - 1.0 K/uL   Eosinophils Relative 2 %   Eosinophils Absolute 0.1 0.0 - 0.5 K/uL   Basophils Relative 1 %   Basophils Absolute 0.1 0.0 - 0.1 K/uL   Immature Granulocytes 0 %   Abs Immature Granulocytes 0.01 0.00 - 0.07 K/uL  Urinalysis, Routine w reflex microscopic  Result Value Ref Range   Color, Urine YELLOW YELLOW   APPearance CLEAR CLEAR   Specific Gravity, Urine 1.010 1.005 - 1.030   pH 7.0 5.0 - 8.0   Glucose, UA >=500 (A) NEGATIVE mg/dL   Hgb urine dipstick NEGATIVE NEGATIVE   Bilirubin Urine NEGATIVE NEGATIVE    Ketones, ur 5 (A) NEGATIVE mg/dL   Protein, ur 30 (A) NEGATIVE mg/dL   Nitrite NEGATIVE NEGATIVE   Leukocytes, UA NEGATIVE NEGATIVE   RBC / HPF 0-5 0 - 5 RBC/hpf   WBC, UA 0-5 0 - 5 WBC/hpf   Bacteria, UA NONE SEEN NONE SEEN   Squamous Epithelial / LPF 0-5 0 - 5  Comprehensive metabolic panel  Result Value Ref Range   Sodium 134 (L) 135 - 145 mmol/L   Potassium 5.1 3.5 - 5.1 mmol/L   Chloride 102 98 - 111 mmol/L   CO2 15 (L) 22 - 32 mmol/L   Glucose, Bld 290 (H) 70 - 99 mg/dL   BUN 50 (H) 6 - 20 mg/dL   Creatinine, Ser 2.80 (H) 0.61 - 1.24 mg/dL   Calcium 9.6 8.9 - 10.3 mg/dL   Total Protein 7.4 6.5 - 8.1 g/dL   Albumin 4.5 3.5 - 5.0 g/dL   AST 46 (H) 15 - 41 U/L   ALT 57 (H) 0 - 44 U/L   Alkaline Phosphatase 56 38 - 126 U/L   Total Bilirubin 1.6 (H)  0.3 - 1.2 mg/dL   GFR calc non Af Amer 26 (L) >60 mL/min   GFR calc Af Amer 30 (L) >60 mL/min   Anion gap 17 (H) 5 - 15  Lipase, blood  Result Value Ref Range   Lipase 35 11 - 51 U/L  POC CBG, ED  Result Value Ref Range   Glucose-Capillary 288 (H) 70 - 99 mg/dL   Dg Chest 2 View  Result Date: 04/09/2018 CLINICAL DATA:  Nausea and headache. History of coronary artery bypass grafting EXAM: CHEST - 2 VIEW COMPARISON:  March 23, 2018 FINDINGS: There is no edema or consolidation. Heart size and pulmonary vascularity are normal. No adenopathy. Patient is status post internal mammary bypass grafting. No bone lesions. IMPRESSION: No edema or consolidation.  Postoperative changes. Electronically Signed   By: Lowella Grip III M.D.   On: 04/09/2018 15:43   Dg Chest 2 View  Result Date: 03/23/2018 CLINICAL DATA:  Chest pain for 6 hours.  History of CABG. EXAM: CHEST - 2 VIEW COMPARISON:  None. FINDINGS: Cardiomediastinal silhouette is normal. Status post median sternotomy for CABG. No pleural effusions or focal consolidations. Trachea projects midline and there is no pneumothorax. Soft tissue planes and included osseous structures are  non-suspicious. IMPRESSION: No active cardiopulmonary process. Electronically Signed   By: Elon Alas M.D.   On: 03/23/2018 17:33   Ct Head Wo Contrast  Result Date: 04/09/2018 CLINICAL DATA:  Acute onset headache with nausea. Recent surgery for adenopathy left eye EXAM: CT HEAD WITHOUT CONTRAST TECHNIQUE: Contiguous axial images were obtained from the base of the skull through the vertex without intravenous contrast. COMPARISON:  None. FINDINGS: Brain: The ventricles are normal in size and configuration. There is no intracranial mass, hemorrhage, extra-axial fluid collection, or midline shift. Brain parenchyma appears unremarkable. No evident acute infarct. Vascular: There is calcification in the distal vertebral arteries. There is calcification in each carotid siphon region. Skull: The bony calvarium appears intact. Sinuses/Orbits: There is mucosal thickening in the posterior right maxillary antrum. There is opacification and mucosal thickening in multiple ethmoid air cells. Orbits appear symmetric bilaterally. Other: Mastoid air cells are clear. IMPRESSION: Brain parenchyma appears unremarkable.  No mass or hemorrhage. There are foci of arterial vascular calcification. There are foci of paranasal sinus disease at several sites. Electronically Signed   By: Lowella Grip III M.D.   On: 04/09/2018 15:53   Nm Myocar Multi W/spect W/wall Motion / Ef  Result Date: 03/24/2018  There was no ST segment deviation noted during stress.  The study is normal.  This is a low risk study.  The left ventricular ejection fraction is mildly decreased (45-54%).  Blood pressure demonstrated a normal response to exercise.  Normal resting and stress perfusion. No ischemia or infarction EF EF estimated at 50% but normal by Echo done same day      Domenic Moras, PA-C 04/09/18 1934    Quintella Reichert, MD 04/11/18 1517

## 2018-04-09 NOTE — ED Notes (Signed)
Patient verbalizes understanding of discharge instructions. Opportunity for questioning and answers were provided. Armband removed by staff, pt discharged from ED home via POV with family. 

## 2018-04-17 ENCOUNTER — Encounter (HOSPITAL_COMMUNITY): Payer: Self-pay | Admitting: Emergency Medicine

## 2018-04-17 ENCOUNTER — Other Ambulatory Visit: Payer: Self-pay

## 2018-04-17 ENCOUNTER — Observation Stay (HOSPITAL_COMMUNITY)
Admission: EM | Admit: 2018-04-17 | Discharge: 2018-04-18 | Disposition: A | Discharge status: Home / Self Care | Payer: No Typology Code available for payment source | Attending: Internal Medicine | Admitting: Internal Medicine

## 2018-04-17 DIAGNOSIS — Z951 Presence of aortocoronary bypass graft: Secondary | ICD-10-CM | POA: Diagnosis not present

## 2018-04-17 DIAGNOSIS — R112 Nausea with vomiting, unspecified: Principal | ICD-10-CM | POA: Insufficient documentation

## 2018-04-17 DIAGNOSIS — I10 Essential (primary) hypertension: Secondary | ICD-10-CM

## 2018-04-17 DIAGNOSIS — E1065 Type 1 diabetes mellitus with hyperglycemia: Secondary | ICD-10-CM | POA: Diagnosis not present

## 2018-04-17 DIAGNOSIS — I2510 Atherosclerotic heart disease of native coronary artery without angina pectoris: Secondary | ICD-10-CM | POA: Insufficient documentation

## 2018-04-17 DIAGNOSIS — E860 Dehydration: Secondary | ICD-10-CM | POA: Diagnosis present

## 2018-04-17 DIAGNOSIS — N179 Acute kidney failure, unspecified: Secondary | ICD-10-CM | POA: Insufficient documentation

## 2018-04-17 DIAGNOSIS — E1022 Type 1 diabetes mellitus with diabetic chronic kidney disease: Secondary | ICD-10-CM | POA: Diagnosis not present

## 2018-04-17 DIAGNOSIS — Z794 Long term (current) use of insulin: Secondary | ICD-10-CM

## 2018-04-17 DIAGNOSIS — E10319 Type 1 diabetes mellitus with unspecified diabetic retinopathy without macular edema: Secondary | ICD-10-CM | POA: Diagnosis present

## 2018-04-17 DIAGNOSIS — IMO0001 Reserved for inherently not codable concepts without codable children: Secondary | ICD-10-CM

## 2018-04-17 DIAGNOSIS — E785 Hyperlipidemia, unspecified: Secondary | ICD-10-CM

## 2018-04-17 DIAGNOSIS — I129 Hypertensive chronic kidney disease with stage 1 through stage 4 chronic kidney disease, or unspecified chronic kidney disease: Secondary | ICD-10-CM | POA: Diagnosis not present

## 2018-04-17 DIAGNOSIS — Z7982 Long term (current) use of aspirin: Secondary | ICD-10-CM | POA: Insufficient documentation

## 2018-04-17 DIAGNOSIS — I252 Old myocardial infarction: Secondary | ICD-10-CM | POA: Diagnosis not present

## 2018-04-17 DIAGNOSIS — N184 Chronic kidney disease, stage 4 (severe): Secondary | ICD-10-CM | POA: Diagnosis not present

## 2018-04-17 DIAGNOSIS — E118 Type 2 diabetes mellitus with unspecified complications: Secondary | ICD-10-CM

## 2018-04-17 DIAGNOSIS — I2583 Coronary atherosclerosis due to lipid rich plaque: Secondary | ICD-10-CM

## 2018-04-17 LAB — CBG MONITORING, ED
GLUCOSE-CAPILLARY: 99 mg/dL (ref 70–99)
Glucose-Capillary: 155 mg/dL — ABNORMAL HIGH (ref 70–99)

## 2018-04-17 LAB — CBC
HCT: 39.3 % (ref 39.0–52.0)
Hemoglobin: 13.3 g/dL (ref 13.0–17.0)
MCH: 31.1 pg (ref 26.0–34.0)
MCHC: 33.8 g/dL (ref 30.0–36.0)
MCV: 92 fL (ref 80.0–100.0)
Platelets: 276 10*3/uL (ref 150–400)
RBC: 4.27 MIL/uL (ref 4.22–5.81)
RDW: 12.1 % (ref 11.5–15.5)
WBC: 9.5 10*3/uL (ref 4.0–10.5)
nRBC: 0 % (ref 0.0–0.2)

## 2018-04-17 LAB — COMPREHENSIVE METABOLIC PANEL
ALT: 36 U/L (ref 0–44)
AST: 28 U/L (ref 15–41)
Albumin: 4.3 g/dL (ref 3.5–5.0)
Alkaline Phosphatase: 62 U/L (ref 38–126)
Anion gap: 11 (ref 5–15)
BILIRUBIN TOTAL: 0.8 mg/dL (ref 0.3–1.2)
BUN: 44 mg/dL — ABNORMAL HIGH (ref 6–20)
CO2: 17 mmol/L — ABNORMAL LOW (ref 22–32)
Calcium: 10.2 mg/dL (ref 8.9–10.3)
Chloride: 110 mmol/L (ref 98–111)
Creatinine, Ser: 3.03 mg/dL — ABNORMAL HIGH (ref 0.61–1.24)
GFR calc Af Amer: 27 mL/min — ABNORMAL LOW (ref >60)
GFR calc non Af Amer: 23 mL/min — ABNORMAL LOW (ref >60)
Glucose, Bld: 169 mg/dL — ABNORMAL HIGH (ref 70–99)
Potassium: 4.3 mmol/L (ref 3.5–5.1)
Sodium: 138 mmol/L (ref 135–145)
Total Protein: 7.9 g/dL (ref 6.5–8.1)

## 2018-04-17 LAB — GLUCOSE, CAPILLARY: Glucose-Capillary: 105 mg/dL — ABNORMAL HIGH (ref 70–99)

## 2018-04-17 LAB — LIPASE, BLOOD: Lipase: 31 U/L (ref 11–51)

## 2018-04-17 MED ORDER — SODIUM CHLORIDE 0.9 % IV SOLN
INTRAVENOUS | Status: DC
  Administered 2018-04-17 – 2018-04-18 (×2): via INTRAVENOUS

## 2018-04-17 MED ORDER — BRIMONIDINE TARTRATE 0.2 % OP SOLN
1.0000 [drp] | Freq: Four times a day (QID) | OPHTHALMIC | Status: DC
  Filled 2018-04-17: qty 5

## 2018-04-17 MED ORDER — ONDANSETRON 4 MG PO TBDP
4.0000 mg | ORAL_TABLET | Freq: Once | ORAL | Status: AC | PRN
  Administered 2018-04-17: 4 mg via ORAL
  Filled 2018-04-17: qty 1

## 2018-04-17 MED ORDER — ONDANSETRON HCL 4 MG PO TABS
4.0000 mg | ORAL_TABLET | Freq: Four times a day (QID) | ORAL | Status: DC | PRN
  Administered 2018-04-18: 4 mg via ORAL
  Filled 2018-04-17: qty 1

## 2018-04-17 MED ORDER — INSULIN ASPART 100 UNIT/ML FLEXPEN: 2.0000 [IU] | PEN_INJECTOR | Freq: Four times a day (QID) | SUBCUTANEOUS | Status: DC

## 2018-04-17 MED ORDER — ENOXAPARIN SODIUM 30 MG/0.3ML ~~LOC~~ SOLN
30.0000 mg | SUBCUTANEOUS | Status: DC
  Administered 2018-04-17: 30 mg via SUBCUTANEOUS
  Filled 2018-04-17: qty 0.3

## 2018-04-17 MED ORDER — INSULIN ASPART 100 UNIT/ML ~~LOC~~ SOLN
0.0000 [IU] | Freq: Three times a day (TID) | SUBCUTANEOUS | Status: DC
  Administered 2018-04-18: 5 [IU] via SUBCUTANEOUS

## 2018-04-17 MED ORDER — METOCLOPRAMIDE HCL 5 MG/ML IJ SOLN
10.0000 mg | Freq: Once | INTRAMUSCULAR | Status: AC
  Administered 2018-04-17: 10 mg via INTRAVENOUS
  Filled 2018-04-17: qty 2

## 2018-04-17 MED ORDER — ATORVASTATIN CALCIUM 80 MG PO TABS
80.0000 mg | ORAL_TABLET | Freq: Every evening | ORAL | Status: DC
  Administered 2018-04-17: 80 mg via ORAL
  Filled 2018-04-17: qty 1

## 2018-04-17 MED ORDER — SODIUM CHLORIDE 0.9 % IV BOLUS
1000.0000 mL | Freq: Once | INTRAVENOUS | Status: AC
  Administered 2018-04-17: 1000 mL via INTRAVENOUS

## 2018-04-17 MED ORDER — INSULIN GLARGINE 100 UNIT/ML ~~LOC~~ SOLN
12.0000 [IU] | Freq: Every day | SUBCUTANEOUS | Status: DC
  Filled 2018-04-17: qty 0.12

## 2018-04-17 MED ORDER — INSULIN ASPART 100 UNIT/ML ~~LOC~~ SOLN: 0.0000 [IU] | Freq: Every day | SUBCUTANEOUS | Status: DC

## 2018-04-17 MED ORDER — AMLODIPINE BESYLATE 5 MG PO TABS
5.0000 mg | ORAL_TABLET | Freq: Every day | ORAL | Status: DC
  Administered 2018-04-17 – 2018-04-18 (×2): 5 mg via ORAL
  Filled 2018-04-17 (×2): qty 1

## 2018-04-17 MED ORDER — PREDNISOLONE ACETATE 1 % OP SUSP
1.0000 [drp] | Freq: Two times a day (BID) | OPHTHALMIC | Status: DC
  Filled 2018-04-17: qty 5

## 2018-04-17 MED ORDER — MORPHINE SULFATE (PF) 4 MG/ML IV SOLN
6.0000 mg | Freq: Once | INTRAVENOUS | Status: AC
  Administered 2018-04-17: 6 mg via INTRAVENOUS
  Filled 2018-04-17: qty 2

## 2018-04-17 MED ORDER — METOPROLOL TARTRATE 25 MG PO TABS
25.0000 mg | ORAL_TABLET | Freq: Three times a day (TID) | ORAL | Status: DC
  Administered 2018-04-17 – 2018-04-18 (×2): 25 mg via ORAL
  Filled 2018-04-17 (×2): qty 1

## 2018-04-17 MED ORDER — ONDANSETRON HCL 4 MG/2ML IJ SOLN
4.0000 mg | Freq: Four times a day (QID) | INTRAMUSCULAR | Status: DC | PRN
  Administered 2018-04-18: 4 mg via INTRAVENOUS
  Filled 2018-04-17: qty 2

## 2018-04-17 MED ORDER — ACETAMINOPHEN 325 MG PO TABS: 650.0000 mg | ORAL_TABLET | Freq: Four times a day (QID) | ORAL | Status: DC | PRN

## 2018-04-17 MED ORDER — PENICILLIN V POTASSIUM 250 MG PO TABS
500.0000 mg | ORAL_TABLET | Freq: Three times a day (TID) | ORAL | Status: DC
  Administered 2018-04-17: 500 mg via ORAL
  Filled 2018-04-17 (×2): qty 2

## 2018-04-17 MED ORDER — PROPARACAINE HCL 0.5 % OP SOLN: 1.0000 [drp] | Freq: Every day | OPHTHALMIC | Status: DC | PRN

## 2018-04-17 MED ORDER — ACETAMINOPHEN 500 MG PO TABS
1000.0000 mg | ORAL_TABLET | Freq: Once | ORAL | Status: AC
  Administered 2018-04-17: 1000 mg via ORAL
  Filled 2018-04-17: qty 2

## 2018-04-17 MED ORDER — ASPIRIN EC 81 MG PO TBEC
81.0000 mg | DELAYED_RELEASE_TABLET | Freq: Every day | ORAL | Status: DC
  Administered 2018-04-17 – 2018-04-18 (×2): 81 mg via ORAL
  Filled 2018-04-17 (×3): qty 1

## 2018-04-17 MED ORDER — ACETAMINOPHEN 650 MG RE SUPP: 650.0000 mg | Freq: Four times a day (QID) | RECTAL | Status: DC | PRN

## 2018-04-17 MED ORDER — TIMOLOL MALEATE 0.5 % OP SOLN
1.0000 [drp] | Freq: Four times a day (QID) | OPHTHALMIC | Status: DC
  Filled 2018-04-17: qty 5

## 2018-04-17 NOTE — H&P (Signed)
History and Physical    Willie Fletcher WFU:[id number] DOB: 1970/06/27 DOA: 04/17/2018  Referring MD/NP/PA:  Elnora Morrison, MD PCP: Billie Ruddy, MD  Patient coming from: home  Chief Complaint: Nausea and vomiting  I have personally briefly reviewed patient's old medical records in [place]   HPI: Willie Fletcher is a 48 y.o. male with medical history significant of DM type 1, CKD stage IV, CAD sp CABG, and HTN; who presents with complaints of nausea and vomiting over the last 4 days.  Patient reports emesis is nonbloody and nonbilious, but has been unable to keep any significant amount of food or liquids down.  Denies trying anything to relieve symptoms.  Blood sugars have been elevated in the 200s to 300s.  He has been taking his insulin although he has not been eating.  Associated symptoms include complaints of a frontal and left sided headache.  Patient has just recently had left left vitrectomy surgery on 03/31/18.  Since that time he has had headaches and was seen in the emergency department on 04/09/2018 for what he states was a severe headache.  At that time he was evaluated and had negative CT imaging of the brain.  Furthermore patient just recently had dental extraction yesterday of his left upper molar that was infected.  He supposed to be on penicillin but has not been able to take much of the medicine.  Patient also notes that he had recently followed up with his optometrist who evaluated his eye pressures and noted that they were going down and more within normal range.  Patient feels dehydrated.  Denies any fever, chest pain, diarrhea, dysuria, urinary frequency, or reports of bleeding.  ED Course: Upon admission to the emergency department patient seen to be afebrile, pulse 83-1 05, blood pressures 128/62 170/88, and O2 saturations maintained on room air.  Labs revealed normal CBC, CO2 17, BUN 44, creatinine 303, and glucose 169.  No imaging studies have been obtained.   Patient had been given 1 L normal saline IV fluids, Zofran p.o., 6 mg of morphine, Tylenol, and Reglan.  TRH called to admit.  Review of Systems  Constitutional: Negative for chills and fever.  HENT: Negative for congestion and nosebleeds.   Eyes: Positive for blurred vision and pain.  Respiratory: Negative for cough and shortness of breath.   Cardiovascular: Negative for chest pain and leg swelling.  Gastrointestinal: Positive for nausea and vomiting. Negative for abdominal pain.  Genitourinary: Negative for dysuria, frequency and hematuria.  Musculoskeletal: Negative for falls and joint pain.  Skin: Negative for itching and rash.  Neurological: Positive for headaches. Negative for loss of consciousness.  Psychiatric/Behavioral: Negative for hallucinations and memory loss.    Past Medical History:  Diagnosis Date  . Coronary artery disease   . Diabetes mellitus without complication ([place])   . Myocardial infarct [facility])     Past Surgical History:  Procedure Laterality Date  . CORONARY ARTERY BYPASS GRAFT    . EYE SURGERY    . TOOTH EXTRACTION       reports that he has never smoked. He has never used smokeless tobacco. He reports that he does not drink alcohol or use drugs.  No Known Allergies  Family History  Problem Relation Age of Onset  . CAD Father   . Hypertension Father   . Hypertension Other   . Cancer Neg Hx   . Diabetes Neg Hx   . Stroke Neg Hx     Prior  to Admission medications   Medication Sig Start Date End Date Taking? Authorizing Provider  acetaminophen (TYLENOL) 325 MG tablet Take 2 tablets (650 mg total) by mouth every 4 (four) hours as needed for mild pain, moderate pain, fever or headache. 03/24/18  Yes Emokpae, Courage, MD  amLODipine (NORVASC) 5 MG tablet Take 5 mg by mouth daily. 02/06/18  Yes [provider]  aspirin EC 81 MG tablet Take 1 tablet (81 mg total) by mouth daily. With Food 03/24/18  Yes Emokpae, Courage, MD  atorvastatin  (LIPITOR) 80 MG tablet Take 80 mg by mouth every evening. 12/22/17  Yes [provider]  brimonidine (ALPHAGAN) 0.2 % ophthalmic solution Place 1 drop into the left eye 4 (four) times daily.   Yes [provider]  furosemide (LASIX) 20 MG tablet Take 20 mg by mouth daily.   Yes [provider]  Insulin Glargine (BASAGLAR KWIKPEN) 100 UNIT/ML SOPN Inject 12 Units into the skin at bedtime.  02/23/18  Yes [provider]  lisinopril (PRINIVIL,ZESTRIL) 10 MG tablet Take 10 mg by mouth daily. 02/06/18  Yes [provider]  metoprolol tartrate (LOPRESSOR) 25 MG tablet Take 25 mg by mouth every 8 (eight) hours. 02/06/18  Yes [provider]  Multiple Vitamin (MULTIVITAMIN WITH MINERALS) TABS tablet Take 1 tablet by mouth daily.   Yes [provider]  NOVOLOG FLEXPEN 100 UNIT/ML FlexPen Inject 2-12 Units as directed 4 (four) times daily.  01/29/18  Yes [provider]  ondansetron (ZOFRAN) 4 MG tablet Take 1 tablet (4 mg total) by mouth every 8 (eight) hours as needed for nausea or vomiting. 04/09/18  Yes Domenic Moras, PA-C  penicillin v potassium (VEETID) 500 MG tablet Take 1 tablet (500 mg total) by mouth 3 (three) times daily. 04/09/18  Yes Domenic Moras, PA-C  prednisoLONE acetate (PRED FORTE) 1 % ophthalmic suspension Place 1 drop into the left eye 2 (two) times daily.    Yes [provider]  proparacaine (ALCAINE) 0.5 % ophthalmic solution Place 1 drop into the left eye daily as needed (pain).   Yes [provider]  timolol (TIMOPTIC) 0.5 % ophthalmic solution Place 1 drop into the left eye 4 (four) times daily.   Yes [provider]  Continuous Blood Gluc Sensor (DEXCOM G6 SENSOR) MISC 1 each by Does not apply route as directed. 04/01/18   Billie Ruddy, MD    Physical Exam:  Constitutional:male who is sick appearing, but able to follow commands. Vitals:   04/17/18 1900 04/17/18 1915 04/17/18 1932  04/17/18 1945  BP: (!) 158/88 (!) 158/80 (!) 158/82 (!) 155/71  Pulse: 87 88 88 83  Resp:  18    Temp:      TempSrc:      SpO2: 99% 97% 99% 99%  Weight:      Height:       Eyes: Ecchymosis over the left eye.  Right eye appears reactive to light and conjunctiva clear. ENMT: Mucous membranes are dry. Posterior pharynx clear of any exudate or lesions.left upper molar removed with minimal swelling and bleeding of the gum. Neck: normal, supple, no masses, no thyromegaly Respiratory: clear to auscultation bilaterally, no wheezing, no crackles. Normal respiratory effort. No accessory muscle use.  Cardiovascular: Regular rate and rhythm, no murmurs / rubs / gallops. No extremity edema. 2+ pedal pulses. No carotid bruits.  Abdomen: no tenderness, no masses palpated. No hepatosplenomegaly. Bowel sounds positive.  Musculoskeletal: no clubbing / cyanosis. No joint deformity upper  and lower extremities. Good ROM, no contractures. Normal muscle tone.  Skin: no rashes, lesions, ulcers. No induration Neurologic: CN 2-12 grossly intact. Sensation intact, DTR normal. Strength 5/5 in all 4.  Psychiatric: Normal judgment and insight. Alert and oriented x 3. Normal mood.     Labs on Admission: I have personally reviewed following labs and imaging studies  CBC: Recent Labs  Lab 04/17/18 1648  WBC 9.5  HGB 13.3  HCT 39.3  MCV 92.0  PLT 412   Basic Metabolic Panel: Recent Labs  Lab 04/17/18 1648  NA 138  K 4.3  CL 110  CO2 17*  GLUCOSE 169*  BUN 44*  CREATININE 3.03*  CALCIUM 10.2   GFR: Estimated Creatinine Clearance: 25.8 mL/min (A) (by C-G formula based on SCr of 3.03 mg/dL (H)). Liver Function Tests: Recent Labs  Lab 04/17/18 1648  AST 28  ALT 36  ALKPHOS 62  BILITOT 0.8  PROT 7.9  ALBUMIN 4.3   Recent Labs  Lab 04/17/18 1648  LIPASE 31   No results for input(s): AMMONIA in the last 168 hours. Coagulation Profile: No results for input(s): INR, PROTIME in the last 168  hours. Cardiac Enzymes: No results for input(s): CKTOTAL, CKMB, CKMBINDEX, TROPONINI in the last 168 hours. BNP (last 3 results) No results for input(s): PROBNP in the last 8760 hours. HbA1C: No results for input(s): HGBA1C in the last 72 hours. CBG: Recent Labs  Lab 04/17/18 1634  GLUCAP 155*   Lipid Profile: No results for input(s): CHOL, HDL, LDLCALC, TRIG, CHOLHDL, LDLDIRECT in the last 72 hours. Thyroid Function Tests: No results for input(s): TSH, T4TOTAL, FREET4, T3FREE, THYROIDAB in the last 72 hours. Anemia Panel: No results for input(s): VITAMINB12, FOLATE, FERRITIN, TIBC, IRON, RETICCTPCT in the last 72 hours. Urine analysis:    Component Value Date/Time   COLORURINE YELLOW 04/09/2018 1800   APPEARANCEUR CLEAR 04/09/2018 1800   LABSPEC 1.010 04/09/2018 1800   PHURINE 7.0 04/09/2018 1800   GLUCOSEU >=500 (A) 04/09/2018 1800   HGBUR NEGATIVE 04/09/2018 1800   BILIRUBINUR NEGATIVE 04/09/2018 1800   KETONESUR 5 (A) 04/09/2018 1800   PROTEINUR 30 (A) 04/09/2018 1800   NITRITE NEGATIVE 04/09/2018 1800   LEUKOCYTESUR NEGATIVE 04/09/2018 1800   Sepsis Labs: No results found for this or any previous visit (from the past 240 hour(s)).   Radiological Exams on Admission: No results found.    Assessment/Plan Nausea and vomiting: Acute.  Patient presents with reports of nausea and vomiting stating inability to keep any p.o. food or liquids down.  Notes did not have access to Zofran at home.  Given 1 L normal saline IV fluids - Admit to a MedSurg bed - Follow-up urinalysis - Monitor intake and output - Antiemetics as needed - Carb modified diet as tolerated - Normal saline IV fluids at 100 mL/h  Headache: Patient reports continued frontal and left-sided headaches.  Reports less severe than previous evaluation on 04/09/2018 where CT scan was obtained and negative for any acute abnormalities. 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  On admission blood sugar 169 without anion gap.  Patient with recent vitrectomy on the left eye on 12/17.  Follow-up with ophthalmologist revealed improving eye pressures. - Hypoglycemic protocol - CBGs q. before meals and at bedtime - Continue home regimen - Continue home regimen of eyedrops   Chronic kidney disease stage IV: Baseline creatinine previously noted to be around  2.4-2.9 on reviewing records on care everywhere.  Initial creatinine 3.03 with BUN elevated at 44.  Patient is on diuretic. - Hold nephrotoxic agent such as lisinopril and Lasix - IV fluids as seen above - Recheck kidney function in a.m.  Essential hypertension - Continue amlodipine and metoprolol - Held lisinopril and Lasix  CAD: Patient with previous history of CABG.  Recent admission for chest pain in 05/2017 noted to have negative stress testing - Continue aspirin and statin  Hyperlipidemia - Continue atorvastatin   DVT prophylaxis: Lovenox   Code Status: Full  Family Communication: Discussed plan of care with the patient family present at bedside Disposition Plan: Likely discharge home in a.m. once tolerating p.o. Consults called: none  Admission status: observation  Norval Morton MD [facility] Pager [phone]   If 7PM-7AM, please contact night-coverage [email or web address] Password [email or web address]  04/17/2018, 8:06 PM

## 2018-04-17 NOTE — ED Provider Notes (Signed)
[facility] EMERGENCY DEPARTMENT Provider Note   CSN: [id number] Arrival date & time: 04/17/18  1611     History   Chief Complaint No chief complaint on file.   HPI Willie Fletcher is a 48 y.o. male.  Patient with history of coronary artery disease, diabetes presents with recurrent nausea vomiting since Monday.  Patient had left retinal surgery on December 17 and has been followed closely by surgeon and was also seen in the ED for different symptoms.  Patient had an appointment today with his ophthalmologist and his pressure was okay and he was healing as expected.  Patient sent over for recurrent headache, vomiting, dental pain.  Patient had tooth extraction this week and is unable to keep his oral antibiotics and.  No fevers or chills.  No abdominal pain.  Patient's blood sugars have been running around 300 and upper 200s the past week.  Patient has been compliant with his insulin medications both long and short acting.     Past Medical History:  Diagnosis Date  . Coronary artery disease   . Diabetes mellitus without complication ([place])   . Myocardial infarct [facility])     Patient Active Problem List   Diagnosis Date Noted  . Nausea and vomiting 04/17/2018  . Diabetic retinopathy associated with type 1 diabetes mellitus ([place]) 04/01/2018  . Chest pain 03/23/2018  . Hyperkalemia 03/23/2018  . Insulin dependent diabetes mellitus with complications ([place]) [id number]  . Essential hypertension 03/23/2018  . Hx of CABG 03/23/2018  . CKD (chronic kidney disease), stage III ([place]) 03/23/2018    Past Surgical History:  Procedure Laterality Date  . CORONARY ARTERY BYPASS GRAFT    . EYE SURGERY    . TOOTH EXTRACTION          Home Medications    Prior to Admission medications   Medication Sig Start Date End Date Taking? Authorizing Provider  acetaminophen (TYLENOL) 325 MG tablet Take 2 tablets (650 mg total) by mouth every 4 (four) hours as needed for mild pain,  moderate pain, fever or headache. 03/24/18  Yes Emokpae, Courage, MD  amLODipine (NORVASC) 5 MG tablet Take 5 mg by mouth daily. 02/06/18  Yes [provider]  aspirin EC 81 MG tablet Take 1 tablet (81 mg total) by mouth daily. With Food 03/24/18  Yes Emokpae, Courage, MD  atorvastatin (LIPITOR) 80 MG tablet Take 80 mg by mouth every evening. 12/22/17  Yes [provider]  brimonidine (ALPHAGAN) 0.2 % ophthalmic solution Place 1 drop into the left eye 4 (four) times daily.   Yes [provider]  furosemide (LASIX) 20 MG tablet Take 20 mg by mouth daily.   Yes [provider]  Insulin Glargine (BASAGLAR KWIKPEN) 100 UNIT/ML SOPN Inject 12 Units into the skin at bedtime.  02/23/18  Yes [provider]  lisinopril (PRINIVIL,ZESTRIL) 10 MG tablet Take 10 mg by mouth daily. 02/06/18  Yes [provider]  metoprolol tartrate (LOPRESSOR) 25 MG tablet Take 25 mg by mouth every 8 (eight) hours. 02/06/18  Yes [provider]  Multiple Vitamin (MULTIVITAMIN WITH MINERALS) TABS tablet Take 1 tablet by mouth daily.   Yes [provider]  NOVOLOG FLEXPEN 100 UNIT/ML FlexPen Inject 2-12 Units as directed 4 (four) times daily.  01/29/18  Yes [provider]  ondansetron (ZOFRAN) 4 MG tablet Take 1 tablet (4 mg total) by mouth every 8 (eight) hours as needed for nausea or vomiting. 04/09/18  Yes Domenic Moras, PA-C  penicillin v potassium (VEETID) 500 MG tablet Take 1 tablet (500 mg total) by mouth 3 (three) times daily. 04/09/18  Yes Domenic Moras, PA-C  prednisoLONE acetate (PRED FORTE) 1 % ophthalmic suspension Place 1 drop into the left eye 2 (two) times daily.    Yes [provider]  proparacaine (ALCAINE) 0.5 % ophthalmic solution Place 1 drop into the left eye daily as needed (pain).   Yes [provider]  timolol (TIMOPTIC) 0.5 % ophthalmic solution Place 1 drop into the left eye 4 (four) times daily.   Yes [provider]  Continuous Blood Gluc Sensor (DEXCOM G6 SENSOR) MISC 1 each by Does not apply route as directed. 04/01/18   Billie Ruddy, MD    Family History Family History  Problem Relation Age of Onset  . CAD Father   . Hypertension Father   . Hypertension Other   . Cancer Neg Hx   . Diabetes Neg Hx   . Stroke Neg Hx     Social History Social History   Tobacco Use  . Smoking status: Never Smoker  . Smokeless tobacco: Never Used  Substance Use Topics  . Alcohol use: Never    Frequency: Never  . Drug use: Never     Allergies   Patient has no known allergies.   Review of Systems Review of Systems  Constitutional: Positive for fatigue. Negative for chills and fever.  HENT: Negative for congestion.   Eyes: Positive for visual disturbance.  Respiratory: Negative for shortness of breath.   Cardiovascular: Negative for chest pain.  Gastrointestinal: Positive for nausea and vomiting. Negative for abdominal pain.  Genitourinary: Negative for dysuria and flank pain.  Musculoskeletal: Negative for back pain, neck pain and neck stiffness.  Skin: Negative for rash.  Neurological: Positive for headaches. Negative for light-headedness.     Physical Exam Updated Vital Signs BP 140/70 (BP Location: Left Arm)   Pulse 67   Temp 98.1 F (36.7 C) (Oral)   Resp 16   Ht 5\' 7"  (1.702 m)   Wt 61.2 kg   SpO2 98%   BMI 21.14 kg/m   Physical Exam Vitals signs and nursing note reviewed.  Constitutional:      Appearance: He is well-developed.  HENT:     Head: Normocephalic.     Comments: Patient is ecchymosis inferior orbital left eye.  Patient is mild conjunctival injection, no active drainage.  Blurry vision left eye similar to the past week per patient.  No significant edema periorbital.  Patient has left upper molar extracted tooth/socket without tenderness, drainage or edema.  No trismus.  Dry mucous membranes. Eyes:     General:        Right eye: No discharge.         Left eye: No discharge.     Conjunctiva/sclera: Conjunctivae normal.  Neck:     Musculoskeletal: Normal range of motion and neck supple.     Trachea: No tracheal deviation.  Cardiovascular:     Rate and Rhythm: Normal rate and regular rhythm.  Pulmonary:     Effort: Pulmonary effort is normal.     Breath sounds: Normal breath sounds.  Abdominal:     General: There is no distension.     Palpations: Abdomen is soft.     Tenderness: There is no abdominal tenderness. There is no guarding.  Skin:    General: Skin is warm.     Findings: No rash.  Neurological:     Mental  Status: He is alert and oriented to person, place, and time.      ED Treatments / Results  Labs (all labs ordered are listed, but only abnormal results are displayed) Labs Reviewed  COMPREHENSIVE METABOLIC PANEL - Abnormal; Notable for the following components:      Result Value   CO2 17 (*)    Glucose, Bld 169 (*)    BUN 44 (*)    Creatinine, Ser 3.03 (*)    GFR calc non Af Amer 23 (*)    GFR calc Af Amer 27 (*)    All other components within normal limits  GLUCOSE, CAPILLARY - Abnormal; Notable for the following components:   Glucose-Capillary 105 (*)    All other components within normal limits  CBG MONITORING, ED - Abnormal; Notable for the following components:   Glucose-Capillary 155 (*)    All other components within normal limits  LIPASE, BLOOD  CBC  URINALYSIS, ROUTINE W REFLEX MICROSCOPIC  CBC  BASIC METABOLIC PANEL  CBG MONITORING, ED    EKG None  Radiology No results found.  Procedures Procedures (including critical care time)  Medications Ordered in ED Medications  aspirin EC tablet 81 mg (81 mg Oral Given 04/17/18 2252)  penicillin v potassium (VEETID) tablet 500 mg (500 mg Oral Given 04/17/18 2253)  amLODipine (NORVASC) tablet 5 mg (5 mg Oral Given 04/17/18 2252)  metoprolol tartrate (LOPRESSOR) tablet 25 mg (25 mg Oral Given 04/17/18 2252)  insulin glargine (LANTUS) injection 12  Units (12 Units Subcutaneous Not Given 04/18/18 0044)  atorvastatin (LIPITOR) tablet 80 mg (80 mg Oral Given 04/17/18 2252)  brimonidine (ALPHAGAN) 0.2 % ophthalmic solution 1 drop (1 drop Left Eye Not Given 04/18/18 0044)  prednisoLONE acetate (PRED FORTE) 1 % ophthalmic suspension 1 drop (1 drop Left Eye Not Given 04/18/18 0044)  proparacaine (ALCAINE) 0.5 % ophthalmic solution 1 drop (has no administration in time range)  timolol (TIMOPTIC) 0.5 % ophthalmic solution 1 drop (1 drop Left Eye Not Given 04/18/18 0044)  enoxaparin (LOVENOX) injection 30 mg (30 mg Subcutaneous Given 04/17/18 2254)  0.9 %  sodium chloride infusion ( Intravenous New Bag/Given 04/17/18 2344)  acetaminophen (TYLENOL) tablet 650 mg (has no administration in time range)    Or  acetaminophen (TYLENOL) suppository 650 mg (has no administration in time range)  ondansetron (ZOFRAN) tablet 4 mg (has no administration in time range)    Or  ondansetron (ZOFRAN) injection 4 mg (has no administration in time range)  insulin aspart (novoLOG) injection 0-9 Units (has no administration in time range)  insulin aspart (novoLOG) injection 0-5 Units (0 Units Subcutaneous Not Given 04/17/18 2239)  butalbital-acetaminophen-caffeine (FIORICET, ESGIC) 50-325-40 MG per tablet 2 tablet (has no administration in time range)  ondansetron (ZOFRAN-ODT) disintegrating tablet 4 mg (4 mg Oral Given 04/17/18 1650)  morphine 4 MG/ML injection 6 mg (6 mg Intravenous Given 04/17/18 1933)  sodium chloride 0.9 % bolus 1,000 mL (0 mLs Intravenous Stopped 04/17/18 2020)  acetaminophen (TYLENOL) tablet 1,000 mg (1,000 mg Oral Given 04/17/18 1933)  metoCLOPramide (REGLAN) injection 10 mg (10 mg Intravenous Given 04/17/18 1933)     Initial Impression / Assessment and Plan / ED Course  I have reviewed the triage vital signs and the nursing notes.  Pertinent labs & imaging results that were available during my care of the patient were reviewed by me and considered in my medical  decision making (see chart for details).    Patient presents for recurrent visit of recurrent vomiting  since Monday and recent dental extraction.  Patient unable to keep fluids and or his oral antibiotics from dental procedure.  Patient has no sign of significant abscess, had normal intraocular pressures at the eye doctors today.  With recurrent visit, uncontrolled blood sugars, recurrent vomiting and unable to keep medications and fluids down discussed with hospitalist for observation.  The patients results and plan were reviewed and discussed.   Any x-rays performed were independently reviewed by myself.   Differential diagnosis were considered with the presenting HPI.  Medications  aspirin EC tablet 81 mg (81 mg Oral Given 04/17/18 2252)  penicillin v potassium (VEETID) tablet 500 mg (500 mg Oral Given 04/17/18 2253)  amLODipine (NORVASC) tablet 5 mg (5 mg Oral Given 04/17/18 2252)  metoprolol tartrate (LOPRESSOR) tablet 25 mg (25 mg Oral Given 04/17/18 2252)  insulin glargine (LANTUS) injection 12 Units (12 Units Subcutaneous Not Given 04/18/18 0044)  atorvastatin (LIPITOR) tablet 80 mg (80 mg Oral Given 04/17/18 2252)  brimonidine (ALPHAGAN) 0.2 % ophthalmic solution 1 drop (1 drop Left Eye Not Given 04/18/18 0044)  prednisoLONE acetate (PRED FORTE) 1 % ophthalmic suspension 1 drop (1 drop Left Eye Not Given 04/18/18 0044)  proparacaine (ALCAINE) 0.5 % ophthalmic solution 1 drop (has no administration in time range)  timolol (TIMOPTIC) 0.5 % ophthalmic solution 1 drop (1 drop Left Eye Not Given 04/18/18 0044)  enoxaparin (LOVENOX) injection 30 mg (30 mg Subcutaneous Given 04/17/18 2254)  0.9 %  sodium chloride infusion ( Intravenous New Bag/Given 04/17/18 2344)  acetaminophen (TYLENOL) tablet 650 mg (has no administration in time range)    Or  acetaminophen (TYLENOL) suppository 650 mg (has no administration in time range)  ondansetron (ZOFRAN) tablet 4 mg (has no administration in time range)    Or    ondansetron (ZOFRAN) injection 4 mg (has no administration in time range)  insulin aspart (novoLOG) injection 0-9 Units (has no administration in time range)  insulin aspart (novoLOG) injection 0-5 Units (0 Units Subcutaneous Not Given 04/17/18 2239)  butalbital-acetaminophen-caffeine (FIORICET, ESGIC) 50-325-40 MG per tablet 2 tablet (has no administration in time range)  ondansetron (ZOFRAN-ODT) disintegrating tablet 4 mg (4 mg Oral Given 04/17/18 1650)  morphine 4 MG/ML injection 6 mg (6 mg Intravenous Given 04/17/18 1933)  sodium chloride 0.9 % bolus 1,000 mL (0 mLs Intravenous Stopped 04/17/18 2020)  acetaminophen (TYLENOL) tablet 1,000 mg (1,000 mg Oral Given 04/17/18 1933)  metoCLOPramide (REGLAN) injection 10 mg (10 mg Intravenous Given 04/17/18 1933)    Vitals:   04/17/18 1945 04/17/18 2034 04/17/18 2201 04/17/18 2350  BP: (!) 155/71 128/60 (!) 160/74 140/70  Pulse: 83 85 90 67  Resp:  16 18 16   Temp:   98.3 F (36.8 C) 98.1 F (36.7 C)  TempSrc:   Oral Oral  SpO2: 99% 98% 97% 98%  Weight:      Height:        Final diagnoses:  Acute renal failure, unspecified acute renal failure type ([place])  Uncontrolled type 1 diabetes mellitus with hyperglycemia ([facility])  Vomiting with nausea, not intractable  Dehydration    Admission/ observation were discussed with the admitting physician, patient and/or family and they are comfortable with the plan.    Final Clinical Impressions(s) / ED Diagnoses   Final diagnoses:  Acute renal failure, unspecified acute renal failure type ([place])  Uncontrolled type 1 diabetes mellitus with hyperglycemia ([facility])  Vomiting with nausea, not intractable  Dehydration    ED Discharge Orders    None  Elnora Morrison, MD 04/18/18 [phone]

## 2018-04-17 NOTE — ED Triage Notes (Signed)
Pt reports left eye surgery a few weeks ago and since then has had massive headaches. Pt reports the last 5 days he has not been able to keep anything down, multiple episodes of vomiting. Pt reports his CBGs have been high for him in the 280s. Pt has been unable to keep down his heart and kidney medications. Followed up with his eye doctor today, reports he feels dehydrated.

## 2018-04-18 ENCOUNTER — Other Ambulatory Visit: Payer: Self-pay

## 2018-04-18 DIAGNOSIS — I2510 Atherosclerotic heart disease of native coronary artery without angina pectoris: Secondary | ICD-10-CM | POA: Diagnosis present

## 2018-04-18 DIAGNOSIS — E785 Hyperlipidemia, unspecified: Secondary | ICD-10-CM | POA: Diagnosis present

## 2018-04-18 DIAGNOSIS — R112 Nausea with vomiting, unspecified: Secondary | ICD-10-CM

## 2018-04-18 DIAGNOSIS — E118 Type 2 diabetes mellitus with unspecified complications: Secondary | ICD-10-CM | POA: Diagnosis not present

## 2018-04-18 DIAGNOSIS — N179 Acute kidney failure, unspecified: Secondary | ICD-10-CM

## 2018-04-18 DIAGNOSIS — E10319 Type 1 diabetes mellitus with unspecified diabetic retinopathy without macular edema: Secondary | ICD-10-CM

## 2018-04-18 DIAGNOSIS — Z794 Long term (current) use of insulin: Secondary | ICD-10-CM

## 2018-04-18 DIAGNOSIS — N184 Chronic kidney disease, stage 4 (severe): Secondary | ICD-10-CM | POA: Diagnosis present

## 2018-04-18 LAB — URINALYSIS, ROUTINE W REFLEX MICROSCOPIC
Bacteria, UA: NONE SEEN
Bilirubin Urine: NEGATIVE
Glucose, UA: NEGATIVE mg/dL
Hgb urine dipstick: NEGATIVE
Ketones, ur: 5 mg/dL — AB
Leukocytes, UA: NEGATIVE
Nitrite: NEGATIVE
Protein, ur: 30 mg/dL — AB
Specific Gravity, Urine: 1.014 (ref 1.005–1.030)
pH: 5 (ref 5.0–8.0)

## 2018-04-18 LAB — BASIC METABOLIC PANEL
Anion gap: 10 (ref 5–15)
BUN: 40 mg/dL — AB (ref 6–20)
CO2: 14 mmol/L — ABNORMAL LOW (ref 22–32)
Calcium: 8.6 mg/dL — ABNORMAL LOW (ref 8.9–10.3)
Chloride: 113 mmol/L — ABNORMAL HIGH (ref 98–111)
Creatinine, Ser: 2.57 mg/dL — ABNORMAL HIGH (ref 0.61–1.24)
GFR, EST AFRICAN AMERICAN: 33 mL/min — AB (ref >60)
GFR, EST NON AFRICAN AMERICAN: 28 mL/min — AB (ref >60)
Glucose, Bld: 124 mg/dL — ABNORMAL HIGH (ref 70–99)
Potassium: 4.3 mmol/L (ref 3.5–5.1)
Sodium: 137 mmol/L (ref 135–145)

## 2018-04-18 LAB — CBC
HCT: 30.1 % — ABNORMAL LOW (ref 39.0–52.0)
HEMOGLOBIN: 10.2 g/dL — AB (ref 13.0–17.0)
MCH: 31.2 pg (ref 26.0–34.0)
MCHC: 33.9 g/dL (ref 30.0–36.0)
MCV: 92 fL (ref 80.0–100.0)
NRBC: 0 % (ref 0.0–0.2)
Platelets: 195 10*3/uL (ref 150–400)
RBC: 3.27 MIL/uL — ABNORMAL LOW (ref 4.22–5.81)
RDW: 12.1 % (ref 11.5–15.5)
WBC: 6.3 10*3/uL (ref 4.0–10.5)

## 2018-04-18 LAB — GLUCOSE, CAPILLARY
Glucose-Capillary: 122 mg/dL — ABNORMAL HIGH (ref 70–99)
Glucose-Capillary: 142 mg/dL — ABNORMAL HIGH (ref 70–99)
Glucose-Capillary: 253 mg/dL — ABNORMAL HIGH (ref 70–99)

## 2018-04-18 MED ORDER — FUROSEMIDE 20 MG PO TABS: 20.0000 mg | ORAL_TABLET | Freq: Every day | ORAL | Status: DC

## 2018-04-18 MED ORDER — ONDANSETRON HCL 4 MG PO TABS: 4.0000 mg | ORAL_TABLET | Freq: Three times a day (TID) | ORAL | 0 refills | Status: DC | PRN

## 2018-04-18 MED ORDER — LISINOPRIL 10 MG PO TABS: 10.0000 mg | ORAL_TABLET | Freq: Every day | ORAL | 2 refills | Status: DC

## 2018-04-18 MED ORDER — BUTALBITAL-APAP-CAFFEINE 50-325-40 MG PO TABS
2.0000 | ORAL_TABLET | Freq: Four times a day (QID) | ORAL | Status: DC | PRN
  Administered 2018-04-18 (×2): 2 via ORAL
  Filled 2018-04-18 (×2): qty 2

## 2018-04-18 MED ORDER — SODIUM CHLORIDE 0.9 % IV SOLN
1.0000 g | Freq: Once | INTRAVENOUS | Status: AC
  Administered 2018-04-18: 1 g via INTRAVENOUS
  Filled 2018-04-18: qty 10

## 2018-04-18 NOTE — Discharge Summary (Signed)
Physician Discharge Summary  BENJAMEN KOELLING BSJ:[id number] DOB: 08-Jun-1970 DOA: 04/17/2018  PCP: Billie Ruddy, MD  Admit date: 04/17/2018 Discharge date: 04/19/2018  Admitted From: home Discharge disposition: home   Recommendations for Outpatient Follow-Up:   Resume lasix and lisinopril if BP improves  Discharge Diagnosis:   Principal Problem:   Nausea and vomiting Active Problems:   Essential hypertension   Hx of CABG   Diabetic retinopathy associated with type 1 diabetes mellitus ([place])   CKD (chronic kidney disease), stage IV (HCC)   CAD (coronary artery disease)   HLD (hyperlipidemia)    Discharge Condition: Improved.  Diet recommendation: Carbohydrate-modified  Wound care: None.  Code status: Full.   History of Present Illness:   Willie Fletcher is a 48 y.o. male with medical history significant ofDM type 1, CKD stage IV,CAD sp CABG,and HTN; who presents with complaints of nausea and vomiting over the last 4 days.  Patient reports emesis is nonbloody and nonbilious, but has been unable to keep any significant amount of food or liquids down.  Denies trying anything to relieve symptoms.  Blood sugars have been elevated in the 200s to 300s.  He has been taking his insulin although he has not been eating.  Associated symptoms include complaints of a frontal and left sided headache.  Patient has just recently had left left vitrectomy surgery on 03/31/18.  Since that time he has had headaches and was seen in the emergency department on 04/09/2018 for what he states was a severe headache.  At that time he was evaluated and had negative CT imaging of the brain.  Furthermore patient just recently had dental extraction yesterday of his left upper molar that was infected.  He supposed to be on penicillin but has not been able to take much of the medicine.  Patient also notes that he had recently followed up with his optometrist who evaluated his eye pressures and noted that  they were going down and more within normal range.  Patient feels dehydrated.  Denies any fever, chest pain, diarrhea, dysuria, urinary frequency, or reports of bleeding.   Hospital Course by Problem:   Nausea and vomiting: Acute.  -resolved -? From abx vs headache -will dose IV rocephin in place  Headache: Patient reports continued frontal and left-sided headaches.  Reports less severe than previous evaluation on 04/09/2018 where CT scan was obtained and negative for any acute abnormalities. - Fioricet as needed  Diabetes mellitus type 1 with diabetic retinopathy:  -continue home regimen  AKI on Chronic kidney disease stage IV: Baseline creatinine previously noted to be around 2.4-2.9 on reviewing records on care everywhere.  Initial creatinine 3.03 with BUN elevated at 44.  Patient is on diuretic. - Hold nephrotoxic agent such as lisinopril and Lasix until seen by PCP  Essential hypertension - Continue amlodipine and metoprolol  CAD: Patient with previous history of CABG.  Recent admission for chest pain in 05/2017 noted to have negative stress testing - Continue aspirin and statin  Hyperlipidemia - Continue atorvastatin    Medical Consultants:      Discharge Exam:   Vitals:   04/17/18 2350 04/18/18 0623  BP: 140/70 119/68  Pulse: 67 72  Resp: 16 17  Temp: 98.1 F (36.7 C) 98.5 F (36.9 C)  SpO2: 98% 97%   Vitals:   04/17/18 2034 04/17/18 2201 04/17/18 2350 04/18/18 0623  BP: 128/60 (!) 160/74 140/70 119/68  Pulse: 85 90 67 72  Resp: 16  18 16 17   Temp:  98.3 F (36.8 C) 98.1 F (36.7 C) 98.5 F (36.9 C)  TempSrc:  Oral Oral Oral  SpO2: 98% 97% 98% 97%  Weight:      Height:        General exam: Appears calm and comfortable.  The results of significant diagnostics from this hospitalization (including imaging, microbiology, ancillary and laboratory) are listed below for reference.     Procedures and Diagnostic Studies:   No results  found.   Labs:   Basic Metabolic Panel: Recent Labs  Lab 04/17/18 1648 04/18/18 0544  NA 138 137  K 4.3 4.3  CL 110 113*  CO2 17* 14*  GLUCOSE 169* 124*  BUN 44* 40*  CREATININE 3.03* 2.57*  CALCIUM 10.2 8.6*   GFR Estimated Creatinine Clearance: 30.4 mL/min (A) (by C-G formula based on SCr of 2.57 mg/dL (H)). Liver Function Tests: Recent Labs  Lab 04/17/18 1648  AST 28  ALT 36  ALKPHOS 62  BILITOT 0.8  PROT 7.9  ALBUMIN 4.3   Recent Labs  Lab 04/17/18 1648  LIPASE 31   No results for input(s): AMMONIA in the last 168 hours. Coagulation profile No results for input(s): INR, PROTIME in the last 168 hours.  CBC: Recent Labs  Lab 04/17/18 1648 04/18/18 0544  WBC 9.5 6.3  HGB 13.3 10.2*  HCT 39.3 30.1*  MCV 92.0 92.0  PLT 276 195   Cardiac Enzymes: No results for input(s): CKTOTAL, CKMB, CKMBINDEX, TROPONINI in the last 168 hours. BNP: Invalid input(s): POCBNP CBG: Recent Labs  Lab 04/17/18 2019 04/17/18 2205 04/18/18 0651 04/18/18 0817 04/18/18 1127  GLUCAP 99 105* 122* 142* 253*   D-Dimer No results for input(s): DDIMER in the last 72 hours. Hgb A1c No results for input(s): HGBA1C in the last 72 hours. Lipid Profile No results for input(s): CHOL, HDL, LDLCALC, TRIG, CHOLHDL, LDLDIRECT in the last 72 hours. Thyroid function studies No results for input(s): TSH, T4TOTAL, T3FREE, THYROIDAB in the last 72 hours.  Invalid input(s): FREET3 Anemia work up No results for input(s): VITAMINB12, FOLATE, FERRITIN, TIBC, IRON, RETICCTPCT in the last 72 hours. Microbiology No results found for this or any previous visit (from the past 240 hour(s)).   Discharge Instructions:   Discharge Instructions    Diet Carb Modified   Complete by:  As directed    Increase activity slowly   Complete by:  As directed      Allergies as of 04/18/2018   No Known Allergies     Medication List    STOP taking these medications   penicillin v potassium 500 MG  tablet Commonly known as:  VEETID     TAKE these medications   acetaminophen 325 MG tablet Commonly known as:  TYLENOL Take 2 tablets (650 mg total) by mouth every 4 (four) hours as needed for mild pain, moderate pain, fever or headache.   amLODipine 5 MG tablet Commonly known as:  NORVASC Take 5 mg by mouth daily.   aspirin EC 81 MG tablet Take 1 tablet (81 mg total) by mouth daily. With Food   atorvastatin 80 MG tablet Commonly known as:  LIPITOR Take 80 mg by mouth every evening.   BASAGLAR KWIKPEN 100 UNIT/ML Sopn Inject 12 Units into the skin at bedtime.   brimonidine 0.2 % ophthalmic solution Commonly known as:  ALPHAGAN Place 1 drop into the left eye 4 (four) times daily.   DEXCOM G6 SENSOR Misc 1 each by Does not  apply route as directed.   furosemide 20 MG tablet Commonly known as:  LASIX Take 1 tablet (20 mg total) by mouth daily. Resume once eating and drinking at baseline What changed:  additional instructions   lisinopril 10 MG tablet Commonly known as:  PRINIVIL,ZESTRIL Take 1 tablet (10 mg total) by mouth daily. Start taking on:  April 22, 2018 What changed:  These instructions start on April 22, 2018. If you are unsure what to do until then, ask your doctor or other care provider.   metoprolol tartrate 25 MG tablet Commonly known as:  LOPRESSOR Take 25 mg by mouth every 8 (eight) hours.   multivitamin with minerals Tabs tablet Take 1 tablet by mouth daily.   NOVOLOG FLEXPEN 100 UNIT/ML FlexPen Generic drug:  insulin aspart Inject 2-12 Units as directed 4 (four) times daily.   ondansetron 4 MG tablet Commonly known as:  ZOFRAN Take 1 tablet (4 mg total) by mouth every 8 (eight) hours as needed for nausea or vomiting.   prednisoLONE acetate 1 % ophthalmic suspension Commonly known as:  PRED FORTE Place 1 drop into the left eye 2 (two) times daily.   proparacaine 0.5 % ophthalmic solution Commonly known as:  ALCAINE Place 1 drop into the  left eye daily as needed (pain).   timolol 0.5 % ophthalmic solution Commonly known as:  TIMOPTIC Place 1 drop into the left eye 4 (four) times daily.      Follow-up Information    Billie Ruddy, MD Follow up in 1 week(s).   Specialty:  Family Medicine Contact information: [place] [address] [phone]            Time coordinating discharge: 25 min  Signed:  Geradine Girt DO  [facility] 04/19/2018, 3:47 PM

## 2018-04-21 ENCOUNTER — Ambulatory Visit: Payer: Managed Care, Other (non HMO) | Admitting: Internal Medicine

## 2018-04-22 ENCOUNTER — Inpatient Hospital Stay: Payer: Managed Care, Other (non HMO) | Admitting: Family Medicine

## 2018-05-06 ENCOUNTER — Ambulatory Visit: Payer: Managed Care, Other (non HMO) | Admitting: Internal Medicine

## 2018-05-20 ENCOUNTER — Encounter: Payer: Self-pay | Admitting: Internal Medicine

## 2018-05-20 ENCOUNTER — Ambulatory Visit: Payer: 59 | Admitting: Internal Medicine

## 2018-05-20 VITALS — BP 146/78 | HR 67 | Ht 67.0 in | Wt 127.2 lb

## 2018-05-20 DIAGNOSIS — E10319 Type 1 diabetes mellitus with unspecified diabetic retinopathy without macular edema: Secondary | ICD-10-CM | POA: Diagnosis not present

## 2018-05-20 DIAGNOSIS — N183 Chronic kidney disease, stage 3 (moderate): Secondary | ICD-10-CM

## 2018-05-20 DIAGNOSIS — E1022 Type 1 diabetes mellitus with diabetic chronic kidney disease: Secondary | ICD-10-CM | POA: Diagnosis not present

## 2018-05-20 MED ORDER — GLUCAGON (RDNA) 1 MG IJ KIT: 1.0000 mg | PACK | Freq: Once | INTRAMUSCULAR | 12 refills | Status: AC | PRN

## 2018-05-20 NOTE — Progress Notes (Signed)
Name: Willie Fletcher  MRN/ DOB: [id number], [birth year]   Age/ Sex: 48 y.o., male    PCP: Billie Ruddy, MD   Reason for Endocrinology Evaluation: Type 1 Diabetes Mellitus     Date of Initial Endocrinology Visit: 05/20/2018     PATIENT IDENTIFIER: Mr. Willie Fletcher is a 48 y.o. male with a past medical history of T1DM, HTN, CAD  and Dyslipidemia. The patient presented for initial endocrinology clinic visit on 05/20/2018 for consultative assistance with his diabetes management.    HPI: Willie Fletcher is here with his fiance Willie Fletcher, they moved from [place] because Willie Fletcher is going to [employer] to study speech pathology   Diagnosed with T1DM  In 1984 Prior Medications tried/Intolerance: Just insulin  Currently checking blood sugars through dexcom  Hypoglycemia episodes : yes              Symptoms: shaky                Frequency: 2/ week Hemoglobin A1c has ranged : no prior history  Patient required assistance for hypoglycemia: no Patient has required hospitalization within the last 1 year from hyper or hypoglycemia: No  In terms of diet, the patient avoids sugar -sweetned evrgaes and avoids snacks   HOME DIABETES REGIMEN: Basaglar 12 units daily  Novolog  I:C ratio 1:14    Statin: Yes ACE-I/ARB: yes Prior Diabetic Education: Yes   CONTINUOUS GLUCOSE MONITORING RECORD INTERPRETATION    Dates of Recording: 1/23-05/20/2018  Sensor description: Dexcom G6  Results statistics:   CGM use % of time 93  Average and SD 186/58  Time in range   52     %  % Time Above 180 48  % Time Below target 0    Glycemic patterns summary: lower BG's during the night, acceptable at breakfast time , hyperglycemia noted with lunch and supper  Hyperglycemic episodes  Post- lunch and supper  Hypoglycemic episodes occurred during the night  Overnight periods: tight to low Bg's      DIABETIC COMPLICATIONS: Microvascular complications:   CKD III, Hx of retinoapthy at age 1  Denies:  neuropathy  Last eye exam: Completed   Macrovascular complications:   CAD  Denies: PVD, CVA   PAST HISTORY: Past Medical History:  Past Medical History:  Diagnosis Date  . Coronary artery disease   . Diabetes mellitus without complication ([place])   . Myocardial infarct [facility])    Past Surgical History:  Past Surgical History:  Procedure Laterality Date  . CORONARY ARTERY BYPASS GRAFT    . EYE SURGERY    . TOOTH EXTRACTION        Social History:  reports that he has never smoked. He has never used smokeless tobacco. He reports that he does not drink alcohol or use drugs. Family History:  Family History  Problem Relation Age of Onset  . CAD Father   . Hypertension Father   . Hypertension Other   . Cancer Neg Hx   . Diabetes Neg Hx   . Stroke Neg Hx      HOME MEDICATIONS: Allergies as of 05/20/2018   No Known Allergies     Medication List       Accurate as of May 20, 2018  4:58 PM. Always use your most recent med list.        acetaminophen 325 MG tablet Commonly known as:  TYLENOL Take 2 tablets (650 mg total) by mouth every 4 (four) hours as needed  for mild pain, moderate pain, fever or headache.   amLODipine 5 MG tablet Commonly known as:  NORVASC Take 5 mg by mouth daily.   aspirin EC 81 MG tablet Take 1 tablet (81 mg total) by mouth daily. With Food   atorvastatin 80 MG tablet Commonly known as:  LIPITOR Take 80 mg by mouth every evening.   BASAGLAR KWIKPEN 100 UNIT/ML Sopn Inject 12 Units into the skin at bedtime.   brimonidine 0.2 % ophthalmic solution Commonly known as:  ALPHAGAN Place 1 drop into the left eye 4 (four) times daily.   DEXCOM G6 SENSOR Misc 1 each by Does not apply route as directed.   furosemide 20 MG tablet Commonly known as:  LASIX Take 1 tablet (20 mg total) by mouth daily. Resume once eating and drinking at baseline   glucagon 1 MG injection Inject 1 mg into the vein once as needed for up to 1 dose.     lisinopril 10 MG tablet Commonly known as:  PRINIVIL,ZESTRIL Take 1 tablet (10 mg total) by mouth daily.   metoprolol tartrate 25 MG tablet Commonly known as:  LOPRESSOR Take 25 mg by mouth every 8 (eight) hours.   multivitamin with minerals Tabs tablet Take 1 tablet by mouth daily.   NOVOLOG FLEXPEN 100 UNIT/ML FlexPen Generic drug:  insulin aspart Inject 2-12 Units as directed 3 (three) times daily with meals.   ondansetron 4 MG tablet Commonly known as:  ZOFRAN Take 1 tablet (4 mg total) by mouth every 8 (eight) hours as needed for nausea or vomiting.   prednisoLONE acetate 1 % ophthalmic suspension Commonly known as:  PRED FORTE Place 1 drop into the left eye 2 (two) times daily.   proparacaine 0.5 % ophthalmic solution Commonly known as:  ALCAINE Place 1 drop into the left eye daily as needed (pain).   timolol 0.5 % ophthalmic solution Commonly known as:  TIMOPTIC Place 1 drop into the left eye 4 (four) times daily.        ALLERGIES: No Known Allergies   REVIEW OF SYSTEMS: A comprehensive ROS was conducted with the patient and is negative except as per HPI and below:  Review of Systems  Constitutional: Positive for weight loss. Negative for malaise/fatigue.  HENT: Negative for congestion and sore throat.   Eyes: Positive for blurred vision and pain.  Respiratory: Negative for cough and shortness of breath.   Cardiovascular: Negative for chest pain and palpitations.  Gastrointestinal: Negative for diarrhea and nausea.  Genitourinary: Positive for frequency.  Neurological: Negative for tingling and tremors.  Endo/Heme/Allergies: Negative for polydipsia.  Psychiatric/Behavioral: Negative for depression. The patient is not nervous/anxious.       OBJECTIVE:   VITAL SIGNS: BP (!) 146/78 (BP Location: Left Arm, Patient Position: Sitting, Cuff Size: Normal)   Pulse 67   Ht 5\' 7"  (1.702 m)   Wt 127 lb 3.2 oz (57.7 kg)   SpO2 99%   BMI 19.92 kg/m     PHYSICAL EXAM:  General: Pt appears well and is in NAD  Hydration: Well-hydrated with moist mucous membranes and good skin turgor  HEENT: Head: Unremarkable with good dentition. Oropharynx clear without exudate.  Eyes: External eye exam normal without stare, lid lag or exophthalmos.  EOM intact.  PERRL.  Neck: General: Supple without adenopathy or carotid bruits. Thyroid: Thyroid size normal.  No goiter or nodules appreciated. No thyroid bruit.  Lungs: Clear with good BS bilat with no rales, rhonchi, or wheezes  Heart: RRR  with normal S1 and S2 and no gallops; no murmurs; no rub  Abdomen: Normoactive bowel sounds, soft, nontender, without masses or organomegaly palpable  Extremities:  Lower extremities - No pretibial edema. No lesions.  Skin: Normal texture and temperature to palpation. No rash noted. No Acanthosis nigricans/skin tags. No lipohypertrophy.  Neuro: MS is good with appropriate affect, pt is alert and Ox3    DM foot exam: 05/20/18 The skin of the feet is intact without sores or ulcerations. The pedal pulses are 2+ on right and 2+ on left. The sensation is intact to a screening 5.07, 10 gram monofilament bilaterally   DATA REVIEWED:  Lab Results  Component Value Date   HGBA1C 6.6 (H) 03/24/2018   Lab Results  Component Value Date   CREATININE 2.57 (H) 04/18/2018      ASSESSMENT / PLAN / RECOMMENDATIONS:   1) Type 1 Diabetes Mellitus, Optimally controlled, With CKD III, retinopathy and macrovascular  complications - Most recent A1c of 6.6 %. Goal A1c < 7.0 %.    Plan: GENERAL:  Placed the patient on avoiding sugar sweetened beverages and snacks between the meals.  In review of the Dexcom today he was noted to have tight BG's and sometimes hypoglycemia overnight, as well as postprandial hyperglycemia with lunch and supper.  Patient encouraged to use prandial insulin before his meals and not after, he was also encouraged to continue using correctional  insulin.  He was also encouraged to use carbohydrate counting apps  fianc is capable of giving the glucagon shot if needed.   MEDICATIONS:  Decrease Basaglar to 10 units daily nsulin to Carb ratio as below :  Breakfast 1:14 (Divide total carbs by 14) Lunch 1:10  (Divide total carbs by 10)  Supper 1: 12  (Divide total carbs by 12)  Novolog correctional insulin: ADD extra units on insulin to your meal-time Novolog dose if your blood sugars are higher than  Use the scale below to help guide you:   Blood sugar before meal Number of units to inject  Less than 190 0 unit  191 -  250 1 units  251 -  310 2 units  311 -  370 3 units  371 -  430 4 units    EDUCATION / INSTRUCTIONS:  BG monitoring instructions: Patient is instructed to check his blood sugars before meals and at bedtime through the Dexcom.  Call [facility] if: BG persistently < 70 or > 300. . I reviewed the Rule of 15 for the treatment of hypoglycemia in detail with the patient. Literature supplied.   2) Diabetic complications:   Eye: Does have known diabetic retinopathy.   Neuro/ Feet: Does not have known diabetic peripheral neuropathy.  Renal: Patient does have known baseline CKD. He is on an ACEI/ARB at present.   3) Lipids: Patient is on a statin.  We will check lipid profile next visit.   4) Hypertension: He is not at goal of < 140/90 mmHg.  We will continue to monitor   Follow-up in 6 weeks   Signed electronically by: Mack Guise, MD  [facility]  [facility] [address] [place], [place] [address] Phone: [phone] FAX: [phone]   CC: Billie Ruddy, [place] [address] Phone: [phone]  Fax: [phone]    Return to Endocrinology clinic as below: Future Appointments  Date Time Provider [place]  07/01/2018  2:40 PM Shamleffer, Melanie Crazier, MD [facility] None

## 2018-05-20 NOTE — Patient Instructions (Addendum)
-   Decrease Basaglar to 10 units daily  - Insulin to Carb ratio as below :  Breakfast 1:14 (Divide total carbs by 14) Lunch 1:10  (Divide total carbs by 10)  Supper 1: 12  (Divide total carbs by 12)  Novolog correctional insulin: ADD extra units on insulin to your meal-time Novolog dose if your blood sugars are higher than  Use the scale below to help guide you:   Blood sugar before meal Number of units to inject  Less than 190 0 unit  191 -  250 1 units  251 -  310 2 units  311 -  370 3 units  371 -  430 4 units     HOW TO TREAT LOW BLOOD SUGARS (Blood sugar LESS THAN 70 MG/DL)  Please follow the RULE OF 15 for the treatment of hypoglycemia treatment (when your (blood sugars are less than 70 mg/dL)    STEP 1: Take 15 grams of carbohydrates when your blood sugar is low, which includes:   3-4 GLUCOSE TABS  OR  3-4 OZ OF JUICE OR REGULAR SODA OR  ONE TUBE OF GLUCOSE GEL     STEP 2: RECHECK blood sugar in 15 MINUTES STEP 3: If your blood sugar is still low at the 15 minute recheck --> then, go back to STEP 1 and treat AGAIN with another 15 grams of carbohydrates.

## 2018-05-28 ENCOUNTER — Other Ambulatory Visit: Payer: Self-pay

## 2018-05-28 ENCOUNTER — Encounter: Payer: Self-pay | Admitting: Internal Medicine

## 2018-05-28 MED ORDER — NOVOLOG FLEXPEN 100 UNIT/ML ~~LOC~~ SOPN: 2.0000 [IU] | PEN_INJECTOR | Freq: Three times a day (TID) | SUBCUTANEOUS | 3 refills | Status: DC

## 2018-05-29 ENCOUNTER — Encounter: Payer: Self-pay | Admitting: Internal Medicine

## 2018-06-02 ENCOUNTER — Encounter: Payer: Self-pay | Admitting: Internal Medicine

## 2018-06-02 ENCOUNTER — Other Ambulatory Visit: Payer: Self-pay

## 2018-06-02 MED ORDER — BASAGLAR KWIKPEN 100 UNIT/ML ~~LOC~~ SOPN: 12.0000 [IU] | PEN_INJECTOR | Freq: Every day | SUBCUTANEOUS | 1 refills | Status: DC

## 2018-06-10 ENCOUNTER — Other Ambulatory Visit: Payer: Self-pay

## 2018-06-10 ENCOUNTER — Encounter: Payer: Self-pay | Admitting: Internal Medicine

## 2018-06-10 DIAGNOSIS — E1022 Type 1 diabetes mellitus with diabetic chronic kidney disease: Secondary | ICD-10-CM

## 2018-06-10 DIAGNOSIS — N183 Chronic kidney disease, stage 3 unspecified: Secondary | ICD-10-CM

## 2018-06-10 MED ORDER — DEXCOM G6 SENSOR MISC: 1.0000 | 2 refills | Status: DC

## 2018-06-10 MED ORDER — DEXCOM G6 TRANSMITTER MISC: 1.0000 | 4 refills | Status: DC

## 2018-06-18 ENCOUNTER — Encounter: Payer: Self-pay | Admitting: Family Medicine

## 2018-06-18 LAB — HM DIABETES EYE EXAM

## 2018-06-26 ENCOUNTER — Encounter: Payer: Self-pay | Admitting: Internal Medicine

## 2018-06-28 ENCOUNTER — Encounter: Payer: Self-pay | Admitting: Family Medicine

## 2018-06-29 ENCOUNTER — Encounter: Payer: Self-pay | Admitting: Internal Medicine

## 2018-07-01 ENCOUNTER — Ambulatory Visit: Payer: 59 | Admitting: Internal Medicine

## 2018-07-01 NOTE — Telephone Encounter (Signed)
Relation to pt: self  Call back number: pleas respond via my chart   Reason for call:   Patient checking on the status of  06/28/2018 mychart message, patient would like a response via my chart regarding Dr. Note reflecting due to patient medical condition that he should remain home and quarantine with his wife.

## 2018-07-03 ENCOUNTER — Encounter: Payer: Self-pay | Admitting: Internal Medicine

## 2018-07-30 MED ORDER — AMLODIPINE BESYLATE 5 MG PO TABS: 5.0000 mg | ORAL_TABLET | Freq: Every day | ORAL | 0 refills | Status: DC

## 2018-10-21 ENCOUNTER — Encounter: Payer: Self-pay | Admitting: Family Medicine

## 2018-10-22 ENCOUNTER — Other Ambulatory Visit: Payer: Self-pay

## 2018-10-22 ENCOUNTER — Ambulatory Visit (INDEPENDENT_AMBULATORY_CARE_PROVIDER_SITE_OTHER): Payer: 59 | Admitting: Family Medicine

## 2018-10-22 ENCOUNTER — Encounter: Payer: Self-pay | Admitting: Family Medicine

## 2018-10-22 DIAGNOSIS — I2510 Atherosclerotic heart disease of native coronary artery without angina pectoris: Secondary | ICD-10-CM | POA: Diagnosis not present

## 2018-10-22 DIAGNOSIS — I10 Essential (primary) hypertension: Secondary | ICD-10-CM

## 2018-10-22 DIAGNOSIS — I2583 Coronary atherosclerosis due to lipid rich plaque: Secondary | ICD-10-CM | POA: Diagnosis not present

## 2018-10-22 DIAGNOSIS — E10319 Type 1 diabetes mellitus with unspecified diabetic retinopathy without macular edema: Secondary | ICD-10-CM | POA: Diagnosis not present

## 2018-10-22 MED ORDER — FUROSEMIDE 20 MG PO TABS: 20.0000 mg | ORAL_TABLET | Freq: Every day | ORAL | 2 refills | Status: DC

## 2018-10-22 MED ORDER — LISINOPRIL 10 MG PO TABS: 10.0000 mg | ORAL_TABLET | Freq: Every day | ORAL | 2 refills | Status: DC

## 2018-10-22 MED ORDER — ATORVASTATIN CALCIUM 80 MG PO TABS: 80.0000 mg | ORAL_TABLET | Freq: Every evening | ORAL | 2 refills | Status: DC

## 2018-10-22 NOTE — Progress Notes (Signed)
Virtual Visit via Video Note  I connected with Willie Fletcher on 10/22/18 at  4:00 PM EDT by a video enabled telemedicine application and verified that I am speaking with the correct person using two identifiers.  Location patient: home Location provider:work or home office Persons participating in the virtual visit: patient, provider  I discussed the limitations of evaluation and management by telemedicine and the availability of in person appointments. The patient expressed understanding and agreed to proceed.   HPI: Pt is a 48 yo male with pmh sig for HTN, DM type I on insulin pump macular edema, CAD, and CKD 3.  Pt states doing well.  Kind of nervous about returning to work.  States everyone is wearing a mask and social distancing.    States sleep is good but may wake up a few times.  Energy is good.  Mood is pretty good.  Pt walking for exercising.  Pt has yet to obtain a bp cuff.  Requesting refills on atorvastatin, lisinopril, and furosemide.  ROS: See pertinent positives and negatives per HPI.  Past Medical History:  Diagnosis Date  . Coronary artery disease   . Diabetes mellitus without complication ([place])   . Myocardial infarct [facility])     Past Surgical History:  Procedure Laterality Date  . CORONARY ARTERY BYPASS GRAFT    . EYE SURGERY    . TOOTH EXTRACTION      Family History  Problem Relation Age of Onset  . CAD Father   . Hypertension Father   . Hypertension Other   . Cancer Neg Hx   . Diabetes Neg Hx   . Stroke Neg Hx      Current Outpatient Medications:  .  acetaminophen (TYLENOL) 325 MG tablet, Take 2 tablets (650 mg total) by mouth every 4 (four) hours as needed for mild pain, moderate pain, fever or headache., Disp: 30 tablet, Rfl: 0 .  amLODipine (NORVASC) 5 MG tablet, Take 1 tablet (5 mg total) by mouth daily., Disp: 90 tablet, Rfl: 0 .  aspirin EC 81 MG tablet, Take 1 tablet (81 mg total) by mouth daily. With Food, Disp: 30 tablet, Rfl: 11 .   atorvastatin (LIPITOR) 80 MG tablet, Take 80 mg by mouth every evening., Disp: , Rfl: 2 .  brimonidine (ALPHAGAN) 0.2 % ophthalmic solution, Place 1 drop into the left eye 4 (four) times daily., Disp: , Rfl:  .  Continuous Blood Gluc Sensor (DEXCOM G6 SENSOR) MISC, 1 each by Does not apply route as directed., Disp: 6 each, Rfl: 2 .  Continuous Blood Gluc Transmit (DEXCOM G6 TRANSMITTER) MISC, 1 each by Does not apply route See admin instructions. Change transmitter every 90 days., Disp: 1 each, Rfl: 4 .  furosemide (LASIX) 20 MG tablet, Take 1 tablet (20 mg total) by mouth daily. Resume once eating and drinking at baseline, Disp: 30 tablet, Rfl:  .  glucagon 1 MG injection, Inject 1 mg into the vein once as needed for up to 1 dose., Disp: 1 each, Rfl: 12 .  Insulin Glargine (BASAGLAR KWIKPEN) 100 UNIT/ML SOPN, Inject 0.12 mLs (12 Units total) into the skin at bedtime., Disp: 15 pen, Rfl: 1 .  lisinopril (PRINIVIL,ZESTRIL) 10 MG tablet, Take 1 tablet (10 mg total) by mouth daily., Disp: , Rfl: 2 .  metoprolol tartrate (LOPRESSOR) 25 MG tablet, Take 25 mg by mouth every 8 (eight) hours., Disp: , Rfl: 3 .  Multiple Vitamin (MULTIVITAMIN WITH MINERALS) TABS tablet, Take 1 tablet by mouth daily.,  Disp: , Rfl:  .  NOVOLOG FLEXPEN 100 UNIT/ML FlexPen, Inject 2-12 Units into the skin 3 (three) times daily with meals., Disp: 15 mL, Rfl: 3 .  ondansetron (ZOFRAN) 4 MG tablet, Take 1 tablet (4 mg total) by mouth every 8 (eight) hours as needed for nausea or vomiting., Disp: 20 tablet, Rfl: 0 .  prednisoLONE acetate (PRED FORTE) 1 % ophthalmic suspension, Place 1 drop into the left eye 2 (two) times daily. , Disp: , Rfl:  .  proparacaine (ALCAINE) 0.5 % ophthalmic solution, Place 1 drop into the left eye daily as needed (pain)., Disp: , Rfl:  .  timolol (TIMOPTIC) 0.5 % ophthalmic solution, Place 1 drop into the left eye 4 (four) times daily., Disp: , Rfl:   EXAM:  VITALS per patient if applicable:  RR between  12-20 bpm  GENERAL: alert, oriented, appears well and in no acute distress  HEENT: atraumatic, conjunctiva clear, no obvious abnormalities on inspection of external nose and ears  NECK: normal movements of the head and neck  LUNGS: on inspection no signs of respiratory distress, breathing rate appears normal, no obvious gross SOB, gasping or wheezing  CV: no obvious cyanosis  MS: moves all visible extremities without noticeable abnormality  PSYCH/NEURO: pleasant and cooperative, no obvious depression or anxiety, speech and thought processing grossly intact  ASSESSMENT AND PLAN:  Discussed the following assessment and plan:  Coronary artery disease due to lipid rich plaque  - Plan: atorvastatin (LIPITOR) 80 MG tablet, Lipid panel  Diabetic retinopathy associated with type 1 diabetes mellitus, macular edema presence unspecified, unspecified laterality, unspecified retinopathy severity (HCC)  -continue insulin pump -continue f/u with Endocrinology -foot exam up to date. - Plan: Hemoglobin A1c  Essential hypertension  -elevated at previous visits per chart review -discussed the importance of checking bp at home.  Pt to get a bp cuff and keep a log of readings. -Discussed lifestyle modifications - Plan: Basic metabolic panel, furosemide (LASIX) 20 MG tablet, lisinopril (ZESTRIL) 10 MG tablet  Patient to have labs done next week. Follow-up in 1 month   I discussed the assessment and treatment plan with the patient. The patient was provided an opportunity to ask questions and all were answered. The patient agreed with the plan and demonstrated an understanding of the instructions.   The patient was advised to call back or seek an in-person evaluation if the symptoms worsen or if the condition fails to improve as anticipated.  Billie Ruddy, MD

## 2018-10-23 ENCOUNTER — Encounter: Payer: Self-pay | Admitting: Family Medicine

## 2018-10-28 ENCOUNTER — Other Ambulatory Visit: Payer: Self-pay | Admitting: Cardiovascular Disease

## 2018-10-31 ENCOUNTER — Other Ambulatory Visit: Payer: Self-pay | Admitting: Family Medicine

## 2018-10-31 DIAGNOSIS — I10 Essential (primary) hypertension: Secondary | ICD-10-CM

## 2018-12-23 ENCOUNTER — Encounter: Payer: Self-pay | Admitting: Family Medicine

## 2018-12-23 ENCOUNTER — Other Ambulatory Visit: Payer: Self-pay | Admitting: Endocrinology

## 2018-12-23 NOTE — Telephone Encounter (Signed)
I think this was intended for you.

## 2018-12-25 ENCOUNTER — Encounter: Payer: Self-pay | Admitting: Internal Medicine

## 2018-12-25 ENCOUNTER — Other Ambulatory Visit: Payer: Self-pay

## 2018-12-25 MED ORDER — NOVOLOG FLEXPEN 100 UNIT/ML ~~LOC~~ SOPN: 2.0000 [IU] | PEN_INJECTOR | Freq: Three times a day (TID) | SUBCUTANEOUS | 3 refills | Status: DC

## 2018-12-28 ENCOUNTER — Other Ambulatory Visit: Payer: Self-pay

## 2018-12-28 MED ORDER — METOPROLOL TARTRATE 25 MG PO TABS: 25.0000 mg | ORAL_TABLET | Freq: Three times a day (TID) | ORAL | 1 refills | Status: DC

## 2019-01-22 ENCOUNTER — Other Ambulatory Visit: Payer: Self-pay | Admitting: Cardiovascular Disease

## 2019-01-22 NOTE — Telephone Encounter (Signed)
Please see note below. 

## 2019-01-22 NOTE — Telephone Encounter (Signed)
Pt overdue for 6 month f/u.  Please contact pt for future appointment. 

## 2019-01-25 ENCOUNTER — Telehealth: Payer: Self-pay | Admitting: Cardiology

## 2019-01-25 NOTE — Telephone Encounter (Signed)
LVM for patient to call and schedule followup with Kerin Ransom.

## 2019-02-19 ENCOUNTER — Other Ambulatory Visit: Payer: Self-pay | Admitting: Cardiovascular Disease

## 2019-03-16 ENCOUNTER — Other Ambulatory Visit: Payer: Self-pay | Admitting: Cardiovascular Disease

## 2019-03-31 ENCOUNTER — Other Ambulatory Visit: Payer: Self-pay | Admitting: Cardiovascular Disease

## 2019-04-03 ENCOUNTER — Other Ambulatory Visit: Payer: Self-pay | Admitting: Internal Medicine

## 2019-05-04 ENCOUNTER — Encounter: Payer: Self-pay | Admitting: Internal Medicine

## 2019-05-11 ENCOUNTER — Ambulatory Visit (INDEPENDENT_AMBULATORY_CARE_PROVIDER_SITE_OTHER): Payer: No Typology Code available for payment source | Admitting: Internal Medicine

## 2019-05-11 ENCOUNTER — Encounter: Payer: Self-pay | Admitting: Internal Medicine

## 2019-05-11 ENCOUNTER — Other Ambulatory Visit: Payer: Self-pay

## 2019-05-11 VITALS — BP 162/88 | HR 66 | Temp 98.0°F | Ht 67.0 in | Wt 125.8 lb

## 2019-05-11 DIAGNOSIS — E785 Hyperlipidemia, unspecified: Secondary | ICD-10-CM

## 2019-05-11 DIAGNOSIS — N1832 Chronic kidney disease, stage 3b: Secondary | ICD-10-CM

## 2019-05-11 DIAGNOSIS — E10319 Type 1 diabetes mellitus with unspecified diabetic retinopathy without macular edema: Secondary | ICD-10-CM

## 2019-05-11 DIAGNOSIS — I10 Essential (primary) hypertension: Secondary | ICD-10-CM | POA: Diagnosis not present

## 2019-05-11 DIAGNOSIS — E1021 Type 1 diabetes mellitus with diabetic nephropathy: Secondary | ICD-10-CM

## 2019-05-11 LAB — POCT GLYCOSYLATED HEMOGLOBIN (HGB A1C): Hemoglobin A1C: 7 % — AB (ref 4.0–5.6)

## 2019-05-11 LAB — LIPID PANEL
Cholesterol: 104 mg/dL (ref 0–200)
HDL: 48.2 mg/dL (ref >39.00)
LDL Cholesterol: 45 mg/dL (ref 0–99)
NonHDL: 55.88
Total CHOL/HDL Ratio: 2
Triglycerides: 54 mg/dL (ref 0.0–149.0)
VLDL: 10.8 mg/dL (ref 0.0–40.0)

## 2019-05-11 LAB — MICROALBUMIN / CREATININE URINE RATIO
Creatinine,U: 67.1 mg/dL
Microalb Creat Ratio: 76.6 mg/g — ABNORMAL HIGH (ref 0.0–30.0)
Microalb, Ur: 51.4 mg/dL — ABNORMAL HIGH (ref 0.0–1.9)

## 2019-05-11 MED ORDER — BASAGLAR KWIKPEN 100 UNIT/ML ~~LOC~~ SOPN: 9.0000 [IU] | PEN_INJECTOR | Freq: Every day | SUBCUTANEOUS | 3 refills | Status: DC

## 2019-05-11 MED ORDER — AMLODIPINE BESYLATE 10 MG PO TABS: 10.0000 mg | ORAL_TABLET | Freq: Every day | ORAL | 3 refills | Status: DC

## 2019-05-11 NOTE — Progress Notes (Signed)
Name: Willie Fletcher  Age/ Sex: 49 y.o., male   MRN/ DOB: [id number], 26-Sep-1970     PCP: Willie Ruddy, MD   Reason for Endocrinology Evaluation: Type 1 Diabetes Mellitus  Initial Endocrine Consultative Visit: 05/20/2018    PATIENT IDENTIFIER: Willie Fletcher is a 48 y.o. male with a past medical history of T1DM, HTN, CAD  and Dyslipidemia . The patient has followed with Endocrinology clinic since 05/20/2018 for consultative assistance with management of his diabetes.  DIABETIC HISTORY:  Willie Fletcher was diagnosed with T1DM in 1984. Moved from [place] because his Willie Fletcher'  His initial  hemoglobin A1c at our clinic was 6.6%.   SUBJECTIVE:   During the last visit (05/20/2018): A1c 6.6% . Adjusted I:C ratio   Today (05/11/2019): Willie Fletcher is here for a follow up on diabetes managemnt. He hasn't been here since 05/2018.  He checks his blood sugars multiple times daily, through the CGM. The patient has had hypoglycemic episodes since the last clinic visit, which typically occur  During the day after a bolus. The patient is symptomatic with these episodes. Otherwise, the patient has not required any recent emergency interventions for hypoglycemia and has not had recent hospitalizations secondary to hyper or hypoglycemic episodes.    ROS: No complaints today     HOME DIABETES REGIMEN:  Basaglar 10 units Novolog I:C ratio   Breakfast 1:14  Lunch 1:10  - has been doing  1:13  Supper 1: 12 - has been doing  1:10   CF : Novolog (BG -130/60)    Statin: Yes ACE-I/ARB: yes    CONTINUOUS GLUCOSE MONITORING RECORD INTERPRETATION    Dates of Recording: 1/6-1/19/21  Sensor description: Dexcom  Results statistics:   CGM use % of time 100  Average and SD 167  Time in range      63  %  % Time Above 180 29  % Time above 250 7  % Time Below target <1     Glycemic patterns summary:   Hyperglycemic episodes  After dinner   Hypoglycemic episodes occurred after a bolus during the  day   Overnight periods: no pattern sometimes glucose is stable and at times it trends down      METER DOWNLOAD SUMMARY: 1/13-1/26/2021 Fingerstick Blood Glucose Tests = 42 Average Number Tests/Day = 3.0 Overall Mean FS Glucose = 169   BG Ranges: Low = 51 High = 316  Hypoglycemic Events/30 Days: BG < 50 = 0 Episodes of symptomatic severe hypoglycemia = 0    DIABETIC COMPLICATIONS: Microvascular complications:   CKD III, Hx of retinoapthy at age 37  Denies: neuropathy  Last eye exam: Completed 02/2019  Macrovascular complications:   CAD  Denies: PVD, CVA   HISTORY:  Past Medical History:  Past Medical History:  Diagnosis Date   Coronary artery disease    Diabetes mellitus without complication ([place])    Myocardial infarct ([place])    Past Surgical History:  Past Surgical History:  Procedure Laterality Date   CORONARY ARTERY BYPASS GRAFT     EYE SURGERY     TOOTH EXTRACTION      Social History:  reports that he has never smoked. He has never used smokeless tobacco. He reports that he does not drink alcohol or use drugs. Family History:  Family History  Problem Relation Age of Onset   CAD Father    Hypertension Father    Hypertension Other    Cancer Neg Hx  Diabetes Neg Hx    Stroke Neg Hx      HOME MEDICATIONS: Allergies as of 05/11/2019   No Known Allergies     Medication List       Accurate as of May 11, 2019  4:12 PM. If you have any questions, ask your nurse or doctor.        STOP taking these medications   ondansetron 4 MG tablet Commonly known as: ZOFRAN Stopped by: Dorita Sciara, MD     TAKE these medications   acetaminophen 325 MG tablet Commonly known as: TYLENOL Take 2 tablets (650 mg total) by mouth every 4 (four) hours as needed for mild pain, moderate pain, fever or headache.   amLODipine 10 MG tablet Commonly known as: NORVASC Take 1 tablet (10 mg total) by mouth daily. What changed:    medication strength  how much to take Changed by: Dorita Sciara, MD   aspirin EC 81 MG tablet Take 1 tablet (81 mg total) by mouth daily. With Food   atorvastatin 80 MG tablet Commonly known as: LIPITOR Take 1 tablet (80 mg total) by mouth every evening.   Basaglar KwikPen 100 UNIT/ML Sopn Inject 0.09 mLs (9 Units total) into the skin daily. What changed: See the new instructions. Changed by: Dorita Sciara, MD   brimonidine 0.2 % ophthalmic solution Commonly known as: ALPHAGAN Place 1 drop into the left eye 4 (four) times daily.   Dexcom G6 Sensor Misc 1 each by Does not apply route as directed.   Dexcom G6 Transmitter Misc 1 each by Does not apply route See admin instructions. Change transmitter every 90 days.   furosemide 20 MG tablet Commonly known as: LASIX Take 1 tablet (20 mg total) by mouth daily.   glucagon 1 MG injection Inject 1 mg into the vein once as needed for up to 1 dose.   lisinopril 10 MG tablet Commonly known as: ZESTRIL TAKE 1 TABLET BY MOUTH DAILY.   metoprolol tartrate 25 MG tablet Commonly known as: LOPRESSOR Take 1 tablet (25 mg total) by mouth every 8 (eight) hours.   multivitamin with minerals Tabs tablet Take 1 tablet by mouth daily.   NovoLOG FlexPen 100 UNIT/ML FlexPen Generic drug: insulin aspart Inject 2-12 Units into the skin 3 (three) times daily with meals.   prednisoLONE acetate 1 % ophthalmic suspension Commonly known as: PRED FORTE Place 1 drop into the left eye 2 (two) times daily.   proparacaine 0.5 % ophthalmic solution Commonly known as: ALCAINE Place 1 drop into the left eye daily as needed (pain).   timolol 0.5 % ophthalmic solution Commonly known as: TIMOPTIC Place 1 drop into the left eye 4 (four) times daily.        OBJECTIVE:   Vital Signs: BP (!) 162/88 (BP Location: Left Arm, Patient Position: Sitting, Cuff Size: Normal)    Pulse 66    Temp 98 F (36.7 C)    Ht 5\' 7"  (1.702 m)    Wt  125 lb 12.8 oz (57.1 kg)    SpO2 99%    BMI 19.70 kg/m   Wt Readings from Last 3 Encounters:  05/11/19 125 lb 12.8 oz (57.1 kg)  05/20/18 127 lb 3.2 oz (57.7 kg)  04/17/18 135 lb (61.2 kg)     Exam: General: Pt appears well and is in NAD  Lungs: Clear with good BS bilat with no rales, rhonchi, or wheezes  Heart: RRR with normal S1 and S2 and no  gallops; no murmurs; no rub  Abdomen: Normoactive bowel sounds, soft, nontender, without masses or organomegaly palpable  Extremities: No pretibial edema.   Neuro: MS is good with appropriate affect, pt is alert and Ox3    DM foot exam: 05/11/2019  The skin of the feet is intact without sores or ulcerations. The pedal pulses are 2+ on right and 2+ on left. The sensation is intact to a screening 5.07, 10 gram monofilament bilaterally            DATA REVIEWED:  Lab Results  Component Value Date   HGBA1C 7.0 (A) 05/11/2019   HGBA1C 6.6 (H) 03/24/2018   Lab Results  Component Value Date   MICROALBUR 51.4 (H) 05/11/2019   LDLCALC 45 05/11/2019   CREATININE 2.57 (H) 04/18/2018   Results for KOBEN, DAMAN (MRN [id number]) as of 05/11/2019 16:11  Ref. Range 05/11/2019 11:07  Creatinine,U Latest Units: mg/dL 67.1  Microalb, Ur Latest Ref Range: 0.0 - 1.9 mg/dL 51.4 (H)  MICROALB/CREAT RATIO Latest Ref Range: 0.0 - 30.0 mg/g 76.6 (H)    ASSESSMENT / PLAN / RECOMMENDATIONS:   1) Type 1 Diabetes Mellitus, Optimally controlled, With CKD III, retinopathy and macrovascular  complications - Most recent A1c of 7.0 %. Goal A1c < 7.5%.    - We reviewed his meter and CGM download, he continues with fluctuating BG's with hyperglycemia followed by post-prandial hypoglycemia.  -  I have encouraged him to take prandial insulin before the meal rather then after, as this habit increases his risk for hypoglycemia. I also have advised him if his pre-meal BG is tight and he is concerned about taking the full dose of Novolog, he could take at least 50%  of the dose  before eating to prevent post-prandial hyperglycemia.  - He will check on CHO content of Pizza  - Will adjust as below     MEDICATIONS: - Decrease Basaglar 9 units daily  - Novolog:  Insulin to Carb ratio as below :  Breakfast 1:14  Lunch 1:14   Supper 1: 10  - CF : Novolog (BG- 130/60)   EDUCATION / INSTRUCTIONS:  BG monitoring instructions: Patient is instructed to check his blood sugars 4 times a day, before meals and bedtime .  Call [facility] if: BG persistently < 70 or > 300.  I reviewed the Rule of 15 for the treatment of hypoglycemia in detail with the patient. Literature supplied.    2) Diabetic complications:   Eye: Does have known diabetic retinopathy.   Neuro/ Feet: Does not have known diabetic peripheral neuropathy .   Renal: Patient does have known baseline CKD. He   is  on an ACEI/ARB at present. Urine albumin/creatinine ratio today is elevated   3) Lipids: Patient is on lipitor 80 mg daily .  LDL at goal.    4) Hypertension:  - Out of  Control. He states compliance with his medication, but he has not seen his cardiologist or nephrologist in the past year. I have urged him to schedule follow up appointments. I will increase his Amlodipine in the meantime from 5 mg to 10 mg daily.   F/U in 6 months    Signed electronically by: Mack Guise, MD  [facility]  [facility] [address] [place], [place] [address] Phone: [phone] FAX: [phone]   CC: Willie Fletcher, [place] [address] Phone: [phone]  Fax: [phone]  Return to Endocrinology  clinic as below: Future Appointments  Date Time Provider [place]  11/08/2019  1:00 PM Hani Patnode, Melanie Crazier, MD [facility] None

## 2019-05-11 NOTE — Patient Instructions (Signed)
-   Decrease Basaglar 9 units daily  - Insulin to Carb ratio as below :  Breakfast 1:14 (Divide total carbs by 14) Lunch 1:14  (Divide total carbs by 14)  Supper 1: 10 (Divide total carbs by 10)  Novolog correctional insulin: ADD extra units on insulin to your meal-time Novolog dose if your blood sugars are higher than  Use the scale below to help guide you:   Blood sugar before meal Number of units to inject  Less than 190 0 unit  191 -  250 1 units  251 -  310 2 units  311 -  370 3 units  371 -  430 4 units     HOW TO TREAT LOW BLOOD SUGARS (Blood sugar LESS THAN 70 MG/DL)  Please follow the RULE OF 15 for the treatment of hypoglycemia treatment (when your (blood sugars are less than 70 mg/dL)    STEP 1: Take 15 grams of carbohydrates when your blood sugar is low, which includes:   3-4 GLUCOSE TABS  OR  3-4 OZ OF JUICE OR REGULAR SODA OR  ONE TUBE OF GLUCOSE GEL     STEP 2: RECHECK blood sugar in 15 MINUTES STEP 3: If your blood sugar is still low at the 15 minute recheck --> then, go back to STEP 1 and treat AGAIN with another 15 grams of carbohydrates.

## 2019-05-12 ENCOUNTER — Telehealth: Payer: Self-pay

## 2019-05-12 NOTE — Telephone Encounter (Signed)
Called to inform pt that I have been attempting to fax forms to [employer] for the last 2 days and have received 7 failed conformations. I called the company and was given 2 more #'s to fax paperwork to which also failed. Suggested that pt pick up paperwork and mail it to them and he stated that he would try to get another fax # and if not then he would stop by to pick up paperwork.

## 2019-06-10 ENCOUNTER — Telehealth: Payer: Self-pay | Admitting: Physician Assistant

## 2019-06-10 NOTE — Telephone Encounter (Signed)
Spoke with patient regarding incoming call about palpitations to triage. Pt states that he is not having palpitations or anything like this and that he was calling to make an appt. regarding high blood pressure and leg swelling. Pt is on metoprolol tartrate 25 mg, lisinopril 10 mg, and his amlodipine has recently been increased to 10mg . Pt states that his blood pressure was "180/something". Advised pt to keep legs elevated if possible, wear compression stockings, and to avoid high salt foods as these can cause increased BP and water retention. Reminded pt to keep his appt. With Kerin Ransom, PA on 07/12/2019 at 3:45 pm. Notified pt I would make provider aware. Pt verbalized understanding and had no further questions at this time.

## 2019-06-10 NOTE — Telephone Encounter (Signed)
Patient c/o Palpitations:  High priority if patient c/o lightheadedness, shortness of breath, or chest pain  1) How long have you had palpitations/irregular HR/ Afib? Are you having the symptoms now?  Past week its a different  type of palpitations. She is not having them at this time  2) Are you currently experiencing lightheadedness, SOB or CP?  All of them when she have palpitations and sometimes it does not  Do you have a history of afib (atrial fibrillation) or irregular heart  rhythm? irregular heartbeats  3) Have you checked your BP or HR? (document readings if available) blood pressure is fine  4) Are you experiencing any other symptoms? No-pt wanted an appt - I made one for 06-16-19

## 2019-06-11 ENCOUNTER — Encounter: Payer: Self-pay | Admitting: Adult Health

## 2019-06-11 ENCOUNTER — Telehealth (INDEPENDENT_AMBULATORY_CARE_PROVIDER_SITE_OTHER): Payer: No Typology Code available for payment source | Admitting: Adult Health

## 2019-06-11 DIAGNOSIS — R600 Localized edema: Secondary | ICD-10-CM | POA: Diagnosis not present

## 2019-06-11 NOTE — Telephone Encounter (Signed)
Spoke to pt wife Velora Mediate and she stated that pt was at work so I did relay the message to her.

## 2019-06-11 NOTE — Telephone Encounter (Signed)
Patient's wife called stating she was very concerned with her husband, he has sudden onset fluid retention in his feet and legs. Patient would like to be called regarding this matter - wife does not want to take patient to ER due to risks of getting covid. Patient first noticed this a couple days ago. Patient ph# [phone]

## 2019-06-11 NOTE — Progress Notes (Signed)
Virtual Visit via Video Note  I connected with Willie Fletcher on 06/11/19 at  2:30 PM EST by a video enabled telemedicine application and verified that I am speaking with the correct person using two identifiers.  Location patient: home Location provider:work or home office Persons participating in the virtual visit: patient, provider  I discussed the limitations of evaluation and management by telemedicine and the availability of in person appointments. The patient expressed understanding and agreed to proceed.   HPI: 49 year old male who  has a past medical history of Coronary artery disease, Diabetes mellitus without complication ([place]), and Myocardial infarct ([place]).  He is a patient of Dr. Volanda Napoleon who I am seeing today for an acute issue of lower extremity non pitting edema.  He reports that he noticed bilateral lower extremity swelling from the knees down to the ankles that started 3 days ago.  Only medication change has been done within the last month was that his Norvasc was increased from 5 mg to 10 mg by endocrinology as his blood pressure was elevated in the office.  Since he has noticed the swelling he is increased his Lasix from 20 mg to 40 mg daily, he reports that he has not noticed much improvement with increased dose of Lasix.  Today he has been home from work and has been elevating his legs for the last 3 hours and has noticed improvement with elevation.  Denies chest pain or shortness of breath  His blood pressure this afternoon was 133/70  BP Readings from Last 3 Encounters:  05/11/19 (!) 162/88  05/20/18 (!) 146/78  04/18/18 119/68   ROS: See pertinent positives and negatives per HPI.  Past Medical History:  Diagnosis Date  . Coronary artery disease   . Diabetes mellitus without complication ([place])   . Myocardial infarct [facility])     Past Surgical History:  Procedure Laterality Date  . CORONARY ARTERY BYPASS GRAFT    . EYE SURGERY    . TOOTH EXTRACTION       Family History  Problem Relation Age of Onset  . CAD Father   . Hypertension Father   . Hypertension Other   . Cancer Neg Hx   . Diabetes Neg Hx   . Stroke Neg Hx        Current Outpatient Medications:  .  acetaminophen (TYLENOL) 325 MG tablet, Take 2 tablets (650 mg total) by mouth every 4 (four) hours as needed for mild pain, moderate pain, fever or headache., Disp: 30 tablet, Rfl: 0 .  amLODipine (NORVASC) 10 MG tablet, Take 1 tablet (10 mg total) by mouth daily., Disp: 90 tablet, Rfl: 3 .  aspirin EC 81 MG tablet, Take 1 tablet (81 mg total) by mouth daily. With Food, Disp: 30 tablet, Rfl: 11 .  atorvastatin (LIPITOR) 80 MG tablet, Take 1 tablet (80 mg total) by mouth every evening., Disp: 90 tablet, Rfl: 2 .  brimonidine (ALPHAGAN) 0.2 % ophthalmic solution, Place 1 drop into the left eye 4 (four) times daily., Disp: , Rfl:  .  Continuous Blood Gluc Sensor (DEXCOM G6 SENSOR) MISC, 1 each by Does not apply route as directed., Disp: 6 each, Rfl: 2 .  Continuous Blood Gluc Transmit (DEXCOM G6 TRANSMITTER) MISC, 1 each by Does not apply route See admin instructions. Change transmitter every 90 days., Disp: 1 each, Rfl: 4 .  furosemide (LASIX) 20 MG tablet, Take 1 tablet (20 mg total) by mouth daily., Disp: 90 tablet, Rfl: 2 .  glucagon 1 MG injection, Inject 1 mg into the vein once as needed for up to 1 dose., Disp: 1 each, Rfl: 12 .  Insulin Glargine (BASAGLAR KWIKPEN) 100 UNIT/ML SOPN, Inject 0.09 mLs (9 Units total) into the skin daily., Disp: 15 mL, Rfl: 3 .  lisinopril (ZESTRIL) 10 MG tablet, TAKE 1 TABLET BY MOUTH DAILY., Disp: 90 tablet, Rfl: 2 .  metoprolol tartrate (LOPRESSOR) 25 MG tablet, Take 1 tablet (25 mg total) by mouth every 8 (eight) hours., Disp: 180 tablet, Rfl: 1 .  Multiple Vitamin (MULTIVITAMIN WITH MINERALS) TABS tablet, Take 1 tablet by mouth daily., Disp: , Rfl:  .  NOVOLOG FLEXPEN 100 UNIT/ML FlexPen, Inject 2-12 Units into the skin 3 (three) times daily  with meals., Disp: 15 mL, Rfl: 3 .  prednisoLONE acetate (PRED FORTE) 1 % ophthalmic suspension, Place 1 drop into the left eye 2 (two) times daily. , Disp: , Rfl:  .  proparacaine (ALCAINE) 0.5 % ophthalmic solution, Place 1 drop into the left eye daily as needed (pain)., Disp: , Rfl:  .  timolol (TIMOPTIC) 0.5 % ophthalmic solution, Place 1 drop into the left eye 4 (four) times daily., Disp: , Rfl:   EXAM:  VITALS per patient if applicable:  GENERAL: alert, oriented, appears well and in no acute distress  HEENT: atraumatic, conjunttiva clear, no obvious abnormalities on inspection of external nose and ears  NECK: normal movements of the head and neck  LUNGS: on inspection no signs of respiratory distress, breathing rate appears normal, no obvious gross SOB, gasping or wheezing  CV: no obvious cyanosis  MS: moves all visible extremities without noticeable abnormality  PSYCH/NEURO: pleasant and cooperative, no obvious depression or anxiety, speech and thought processing grossly intact  ASSESSMENT AND PLAN:  Discussed the following assessment and plan:  1. Lower extremity edema -Causative factor is likely the increase in Norvasc from 5 mg to 10 mg.  He was advised to cut back down to 5 mg and Norvasc, continue with 40 mg of Lasix throughout the weekend and monitor his blood pressure closely.  He does have a follow-up appointment with his nephrologist on Monday and he will inform them of what is going on as well.  He was advised to take his blood pressure log with him to nephrology.     I discussed the assessment and treatment plan with the patient. The patient was provided an opportunity to ask questions and all were answered. The patient agreed with the plan and demonstrated an understanding of the instructions.   The patient was advised to call back or seek an in-person evaluation if the symptoms worsen or if the condition fails to improve as anticipated.   Dorothyann Peng, NP

## 2019-06-11 NOTE — Telephone Encounter (Signed)
Please advise 

## 2019-06-15 ENCOUNTER — Other Ambulatory Visit: Payer: Self-pay | Admitting: Family Medicine

## 2019-07-12 ENCOUNTER — Ambulatory Visit (INDEPENDENT_AMBULATORY_CARE_PROVIDER_SITE_OTHER): Payer: 59 | Admitting: Cardiology

## 2019-07-12 ENCOUNTER — Other Ambulatory Visit: Payer: Self-pay

## 2019-07-12 ENCOUNTER — Encounter: Payer: Self-pay | Admitting: Cardiology

## 2019-07-12 VITALS — BP 138/74 | HR 61 | Ht 67.0 in | Wt 126.5 lb

## 2019-07-12 DIAGNOSIS — I10 Essential (primary) hypertension: Secondary | ICD-10-CM

## 2019-07-12 DIAGNOSIS — I2583 Coronary atherosclerosis due to lipid rich plaque: Secondary | ICD-10-CM | POA: Diagnosis not present

## 2019-07-12 DIAGNOSIS — I2510 Atherosclerotic heart disease of native coronary artery without angina pectoris: Secondary | ICD-10-CM

## 2019-07-12 NOTE — Patient Instructions (Signed)
Medication Instructions:  Your physician recommends that you continue on your current medications as directed. Please refer to the Current Medication list given to you today.  *If you need a refill on your cardiac medications before your next appointment, please call your pharmacy*    Follow-Up: At [facility], you and your health needs are our priority.  As part of our continuing mission to provide you with exceptional heart care, we have created designated Provider Care Teams.  These Care Teams include your primary Cardiologist (physician) and Advanced Practice Providers (APPs -  Physician Assistants and Nurse Practitioners) who all work together to provide you with the care you need, when you need it.  We recommend signing up for the patient portal called "MyChart".  Sign up information is provided on this After Visit Summary.  MyChart is used to connect with patients for Virtual Visits (Telemedicine).  Patients are able to view lab/test results, encounter notes, upcoming appointments, etc.  Non-urgent messages can be sent to your provider as well.   To learn more about what you can do with MyChart, go to [email or web address].    Your next appointment:   12 month(s)  The format for your next appointment:   In Person  Provider:   Quay Burow, MD (ONLY)   Other Instructions Please call our office 2 months in advance to schedule your follow-up appointment with Dr. Gwenlyn Found.

## 2019-07-12 NOTE — Progress Notes (Signed)
Cardiology Office Note:    Date:  07/12/2019   ID:  Willie Fletcher, DOB 1970-08-16, MRN [id number]  PCP:  Billie Ruddy, MD  Cardiologist:  No primary care provider on file.  Electrophysiologist:  None   Referring MD: Billie Ruddy, MD   No chief complaint on file.   History of Present Illness:    Willie Fletcher is a 49 y.o. male with a hx of type 1 insulin-dependent diabetes, coronary disease, status post CABG x3 in 2017 in [place], chronic renal insufficiency stage IV, hypertension, and dyslipidemia.  The patient had bypass grafting x3 with an LIMA to the LAD, and SVG to the diagonal, and SVG to the OM.  He was followed by cardiology in [place] until he moved here in May of 2019.  He presented to the emergency room at [facility] in Dec 2019 with substernal chest pain.  He was seen in consult by Dr. Harl Bowie.  The patient had a Myoview study which was low risk.  We saw him in follow up Dec 2019 but not since.  Fortunately he has done well from a cardiac standpoint.  He is followed by his PCP and at [facility].  Recently he had some LE edema and his Amlodipine was decreased and his Lasix increased.  This seems to have alleviated his edema.  He denies any chest pain.  He works at [employer] and says he gets "10k steps a day".    Past Medical History:  Diagnosis Date  . Coronary artery disease   . Diabetes mellitus without complication ([place])   . Myocardial infarct [facility])     Past Surgical History:  Procedure Laterality Date  . CORONARY ARTERY BYPASS GRAFT    . EYE SURGERY    . TOOTH EXTRACTION      Current Medications: Current Meds  Medication Sig  . acetaminophen (TYLENOL) 325 MG tablet Take 2 tablets (650 mg total) by mouth every 4 (four) hours as needed for mild pain, moderate pain, fever or headache.  [name] amLODipine (NORVASC) 10 MG tablet Take 1 tablet (10 mg total) by mouth daily. (Patient taking differently: Take 5 mg by mouth daily. 10 mg  causes lower extremity edema)  . aspirin EC 81 MG tablet Take 1 tablet (81 mg total) by mouth daily. With Food  . atorvastatin (LIPITOR) 80 MG tablet Take 1 tablet (80 mg total) by mouth every evening.  . Continuous Blood Gluc Sensor (DEXCOM G6 SENSOR) MISC 1 each by Does not apply route as directed.  . Continuous Blood Gluc Transmit (DEXCOM G6 TRANSMITTER) MISC 1 each by Does not apply route See admin instructions. Change transmitter every 90 days.  . furosemide (LASIX) 20 MG tablet Take 1 tablet (20 mg total) by mouth daily.  [name] glucagon 1 MG injection Inject 1 mg into the vein once as needed for up to 1 dose.  . Insulin Glargine (BASAGLAR KWIKPEN) 100 UNIT/ML SOPN Inject 0.09 mLs (9 Units total) into the skin daily.  [name] lisinopril (ZESTRIL) 10 MG tablet TAKE 1 TABLET BY MOUTH DAILY.  . metoprolol tartrate (LOPRESSOR) 25 MG tablet TAKE 1 TABLET (25 MG TOTAL) BY MOUTH EVERY 8 (EIGHT) HOURS.  . Multiple Vitamin (MULTIVITAMIN WITH MINERALS) TABS tablet Take 1 tablet by mouth daily.  [name] NOVOLOG FLEXPEN 100 UNIT/ML FlexPen Inject 2-12 Units into the skin 3 (three) times daily with meals.  . sodium bicarbonate 650 MG tablet Take 1,300 mg by mouth 2 (two) times  daily.     Allergies:   Patient has no known allergies.   Social History   Socioeconomic History  . Marital status: Married    Spouse name: Not on file  . Number of children: Not on file  . Years of education: Not on file  . Highest education level: Not on file  Occupational History  . Not on file  Tobacco Use  . Smoking status: Never Smoker  . Smokeless tobacco: Never Used  Substance and Sexual Activity  . Alcohol use: Never  . Drug use: Never  . Sexual activity: Not on file  Other Topics Concern  . Not on file  Social History Narrative  . Not on file   Social Determinants of Health   Financial Resource Strain:   . Difficulty of Paying Living Expenses:   Food Insecurity:   . Worried About Charity fundraiser in the Last  Year:   . Arboriculturist in the Last Year:   Transportation Needs:   . Film/video editor (Medical):   [name] Lack of Transportation (Non-Medical):   Physical Activity:   . Days of Exercise per Week:   . Minutes of Exercise per Session:   Stress:   . Feeling of Stress :   Social Connections:   . Frequency of Communication with Friends and Family:   . Frequency of Social Gatherings with Friends and Family:   . Attends Religious Services:   . Active Member of Clubs or Organizations:   . Attends Archivist Meetings:   [name] Marital Status:      Family History: The patient's family history includes CAD in his father; Hypertension in his father and another family member. There is no history of Cancer, Diabetes, or Stroke.  ROS:   Please see the history of present illness.     All other systems reviewed and are negative.  EKGs/Labs/Other Studies Reviewed:    The following studies were reviewed today: Myoview Dec 2019  EKG:  EKG is ordered today.  The ekg ordered today demonstrates NSR, HR 61  Recent Labs: No results found for requested labs within last 8760 hours.  Recent Lipid Panel    Component Value Date/Time   CHOL 104 05/11/2019 1107   TRIG 54.0 05/11/2019 1107   HDL 48.20 05/11/2019 1107   CHOLHDL 2 05/11/2019 1107   VLDL 10.8 05/11/2019 1107   LDLCALC 45 05/11/2019 1107    Physical Exam:    VS:  BP 138/74   Pulse 61   Ht 5\' 7"  (1.702 m)   Wt 126 lb 8 oz (57.4 kg)   SpO2 98%   BMI 19.81 kg/m     Wt Readings from Last 3 Encounters:  07/12/19 126 lb 8 oz (57.4 kg)  05/11/19 125 lb 12.8 oz (57.1 kg)  05/20/18 127 lb 3.2 oz (57.7 kg)     GEN:  Well nourished, well developed in no acute distress HEENT: Normal NECK: No JVD; No carotid bruits CARDIAC: RRR, no murmurs, rubs, gallops RESPIRATORY:  Clear to auscultation without rales, wheezing or rhonchi  ABDOMEN: Soft, non-tender, non-distended MUSCULOSKELETAL:  No edema; No deformity  SKIN: Warm and  dry NEUROLOGIC:  Alert and oriented x 3 PSYCHIATRIC:  Normal affect   ASSESSMENT:    Hx of CABG CAB[place] x 3 in [place]- [place] LIMA-LAD, SVG-Dx, SVG-OM Myoview low risk dec 2019- EF 45-55%  Insulin dependent diabetes mellitus with complications ([place]) Type 1 IDDM with retinopathy, neuropathy, and nephropathy  Essential  hypertension Controlled  CKD (chronic kidney disease), stage III ([place]) He is to be established with [facility]  HLD- On high dose statin Rx- followed by PCP  PLAN:    Same cardiac Rx- f/u Dr Gwenlyn Found in one year- sooner if the need arises.    Medication Adjustments/Labs and Tests Ordered: Current medicines are reviewed at length with the patient today.  Concerns regarding medicines are outlined above.  Orders Placed This Encounter  Procedures  . EKG 12-Lead   No orders of the defined types were placed in this encounter.   Patient Instructions  Medication Instructions:  Your physician recommends that you continue on your current medications as directed. Please refer to the Current Medication list given to you today.  *If you need a refill on your cardiac medications before your next appointment, please call your pharmacy*    Follow-Up: At [facility], you and your health needs are our priority.  As part of our continuing mission to provide you with exceptional heart care, we have created designated Provider Care Teams.  These Care Teams include your primary Cardiologist (physician) and Advanced Practice Providers (APPs -  Physician Assistants and Nurse Practitioners) who all work together to provide you with the care you need, when you need it.  We recommend signing up for the patient portal called "MyChart".  Sign up information is provided on this After Visit Summary.  MyChart is used to connect with patients for Virtual Visits (Telemedicine).  Patients are able to view lab/test results, encounter notes, upcoming appointments, etc.  Non-urgent  messages can be sent to your provider as well.   To learn more about what you can do with MyChart, go to [email or web address].    Your next appointment:   12 month(s)  The format for your next appointment:   In Person  Provider:   Quay Burow, MD (ONLY)   Other Instructions Please call our office 2 months in advance to schedule your follow-up appointment with Dr. Gwenlyn Found.     Angelena Form, PA-C  07/12/2019 3:53 PM    [facility]

## 2019-07-31 ENCOUNTER — Other Ambulatory Visit: Payer: Self-pay | Admitting: Family Medicine

## 2019-07-31 DIAGNOSIS — I2583 Coronary atherosclerosis due to lipid rich plaque: Secondary | ICD-10-CM

## 2019-07-31 DIAGNOSIS — I2510 Atherosclerotic heart disease of native coronary artery without angina pectoris: Secondary | ICD-10-CM

## 2019-07-31 DIAGNOSIS — I10 Essential (primary) hypertension: Secondary | ICD-10-CM

## 2019-08-01 ENCOUNTER — Other Ambulatory Visit: Payer: Self-pay | Admitting: Internal Medicine

## 2019-08-02 IMAGING — DX DG CHEST 2V
2 series · 2 of 2 positions shown · non-contrast
Comparison: None.

CLINICAL DATA: Chest pain for 6 hours.  History of CABG.

EXAM:
CHEST - 2 VIEW

[chest pa]
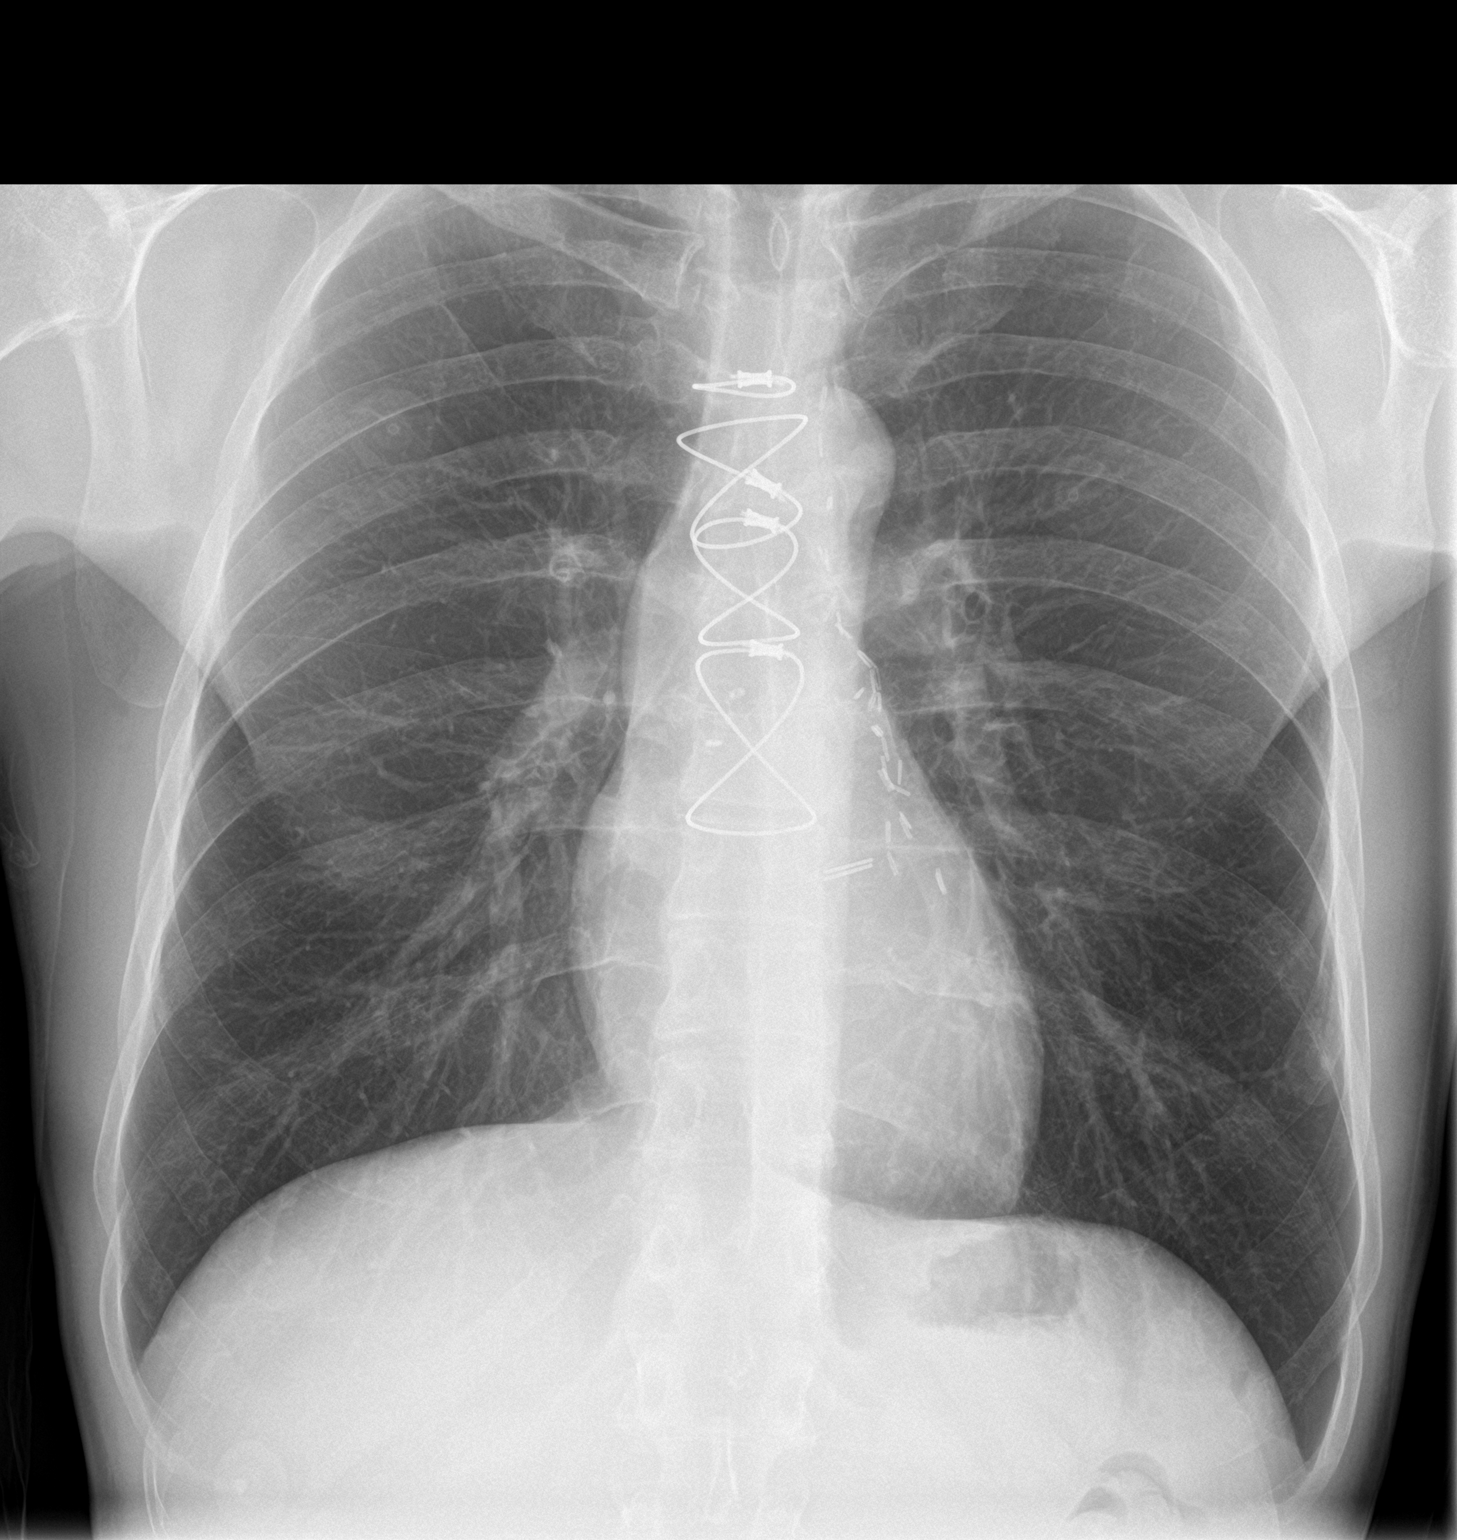

[chest lat]
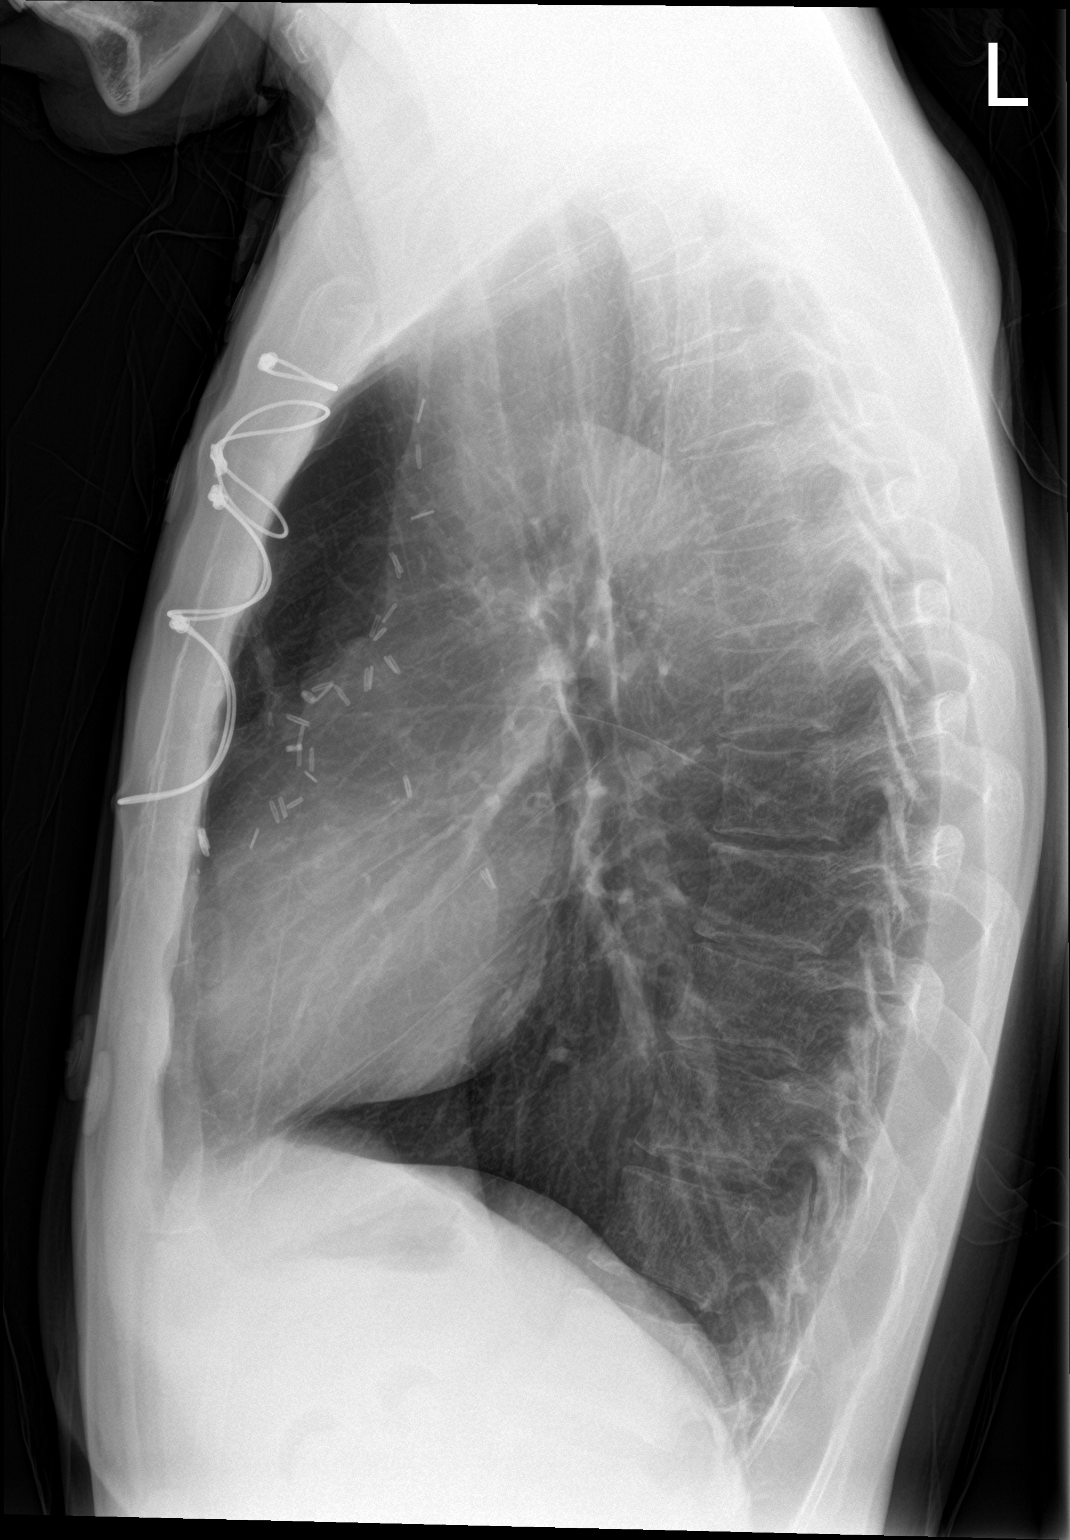

[2 of 2 positions shown; findings below may reference images not displayed]

FINDINGS: Cardiomediastinal silhouette is normal. Status post median
sternotomy for CABG. No pleural effusions or focal consolidations.
Trachea projects midline and there is no pneumothorax. Soft tissue
planes and included osseous structures are non-suspicious.
IMPRESSION: No active cardiopulmonary process.

## 2019-08-19 IMAGING — CR DG CHEST 2V
2 series · 2 of 2 positions shown · non-contrast
Comparison: March 23, 2018

CLINICAL DATA: Nausea and headache. History of coronary artery
bypass grafting

EXAM:
CHEST - 2 VIEW

[chest lat]
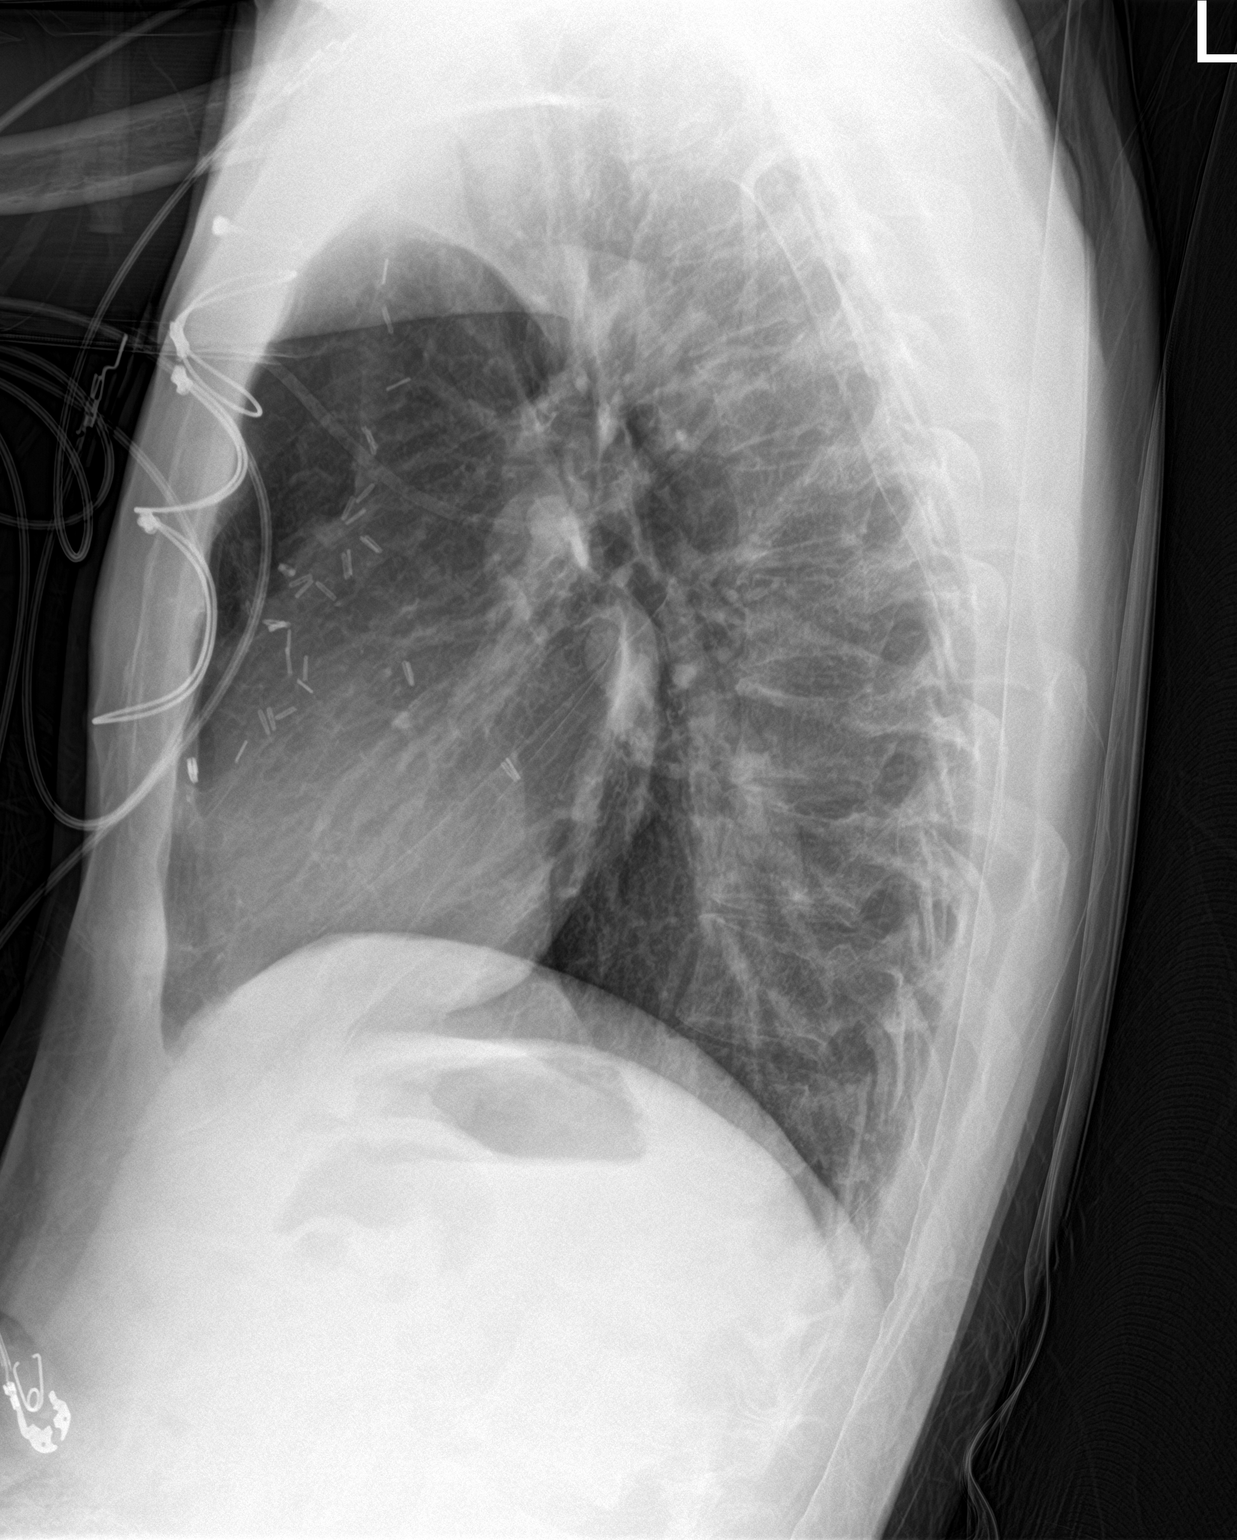

[chest ap]
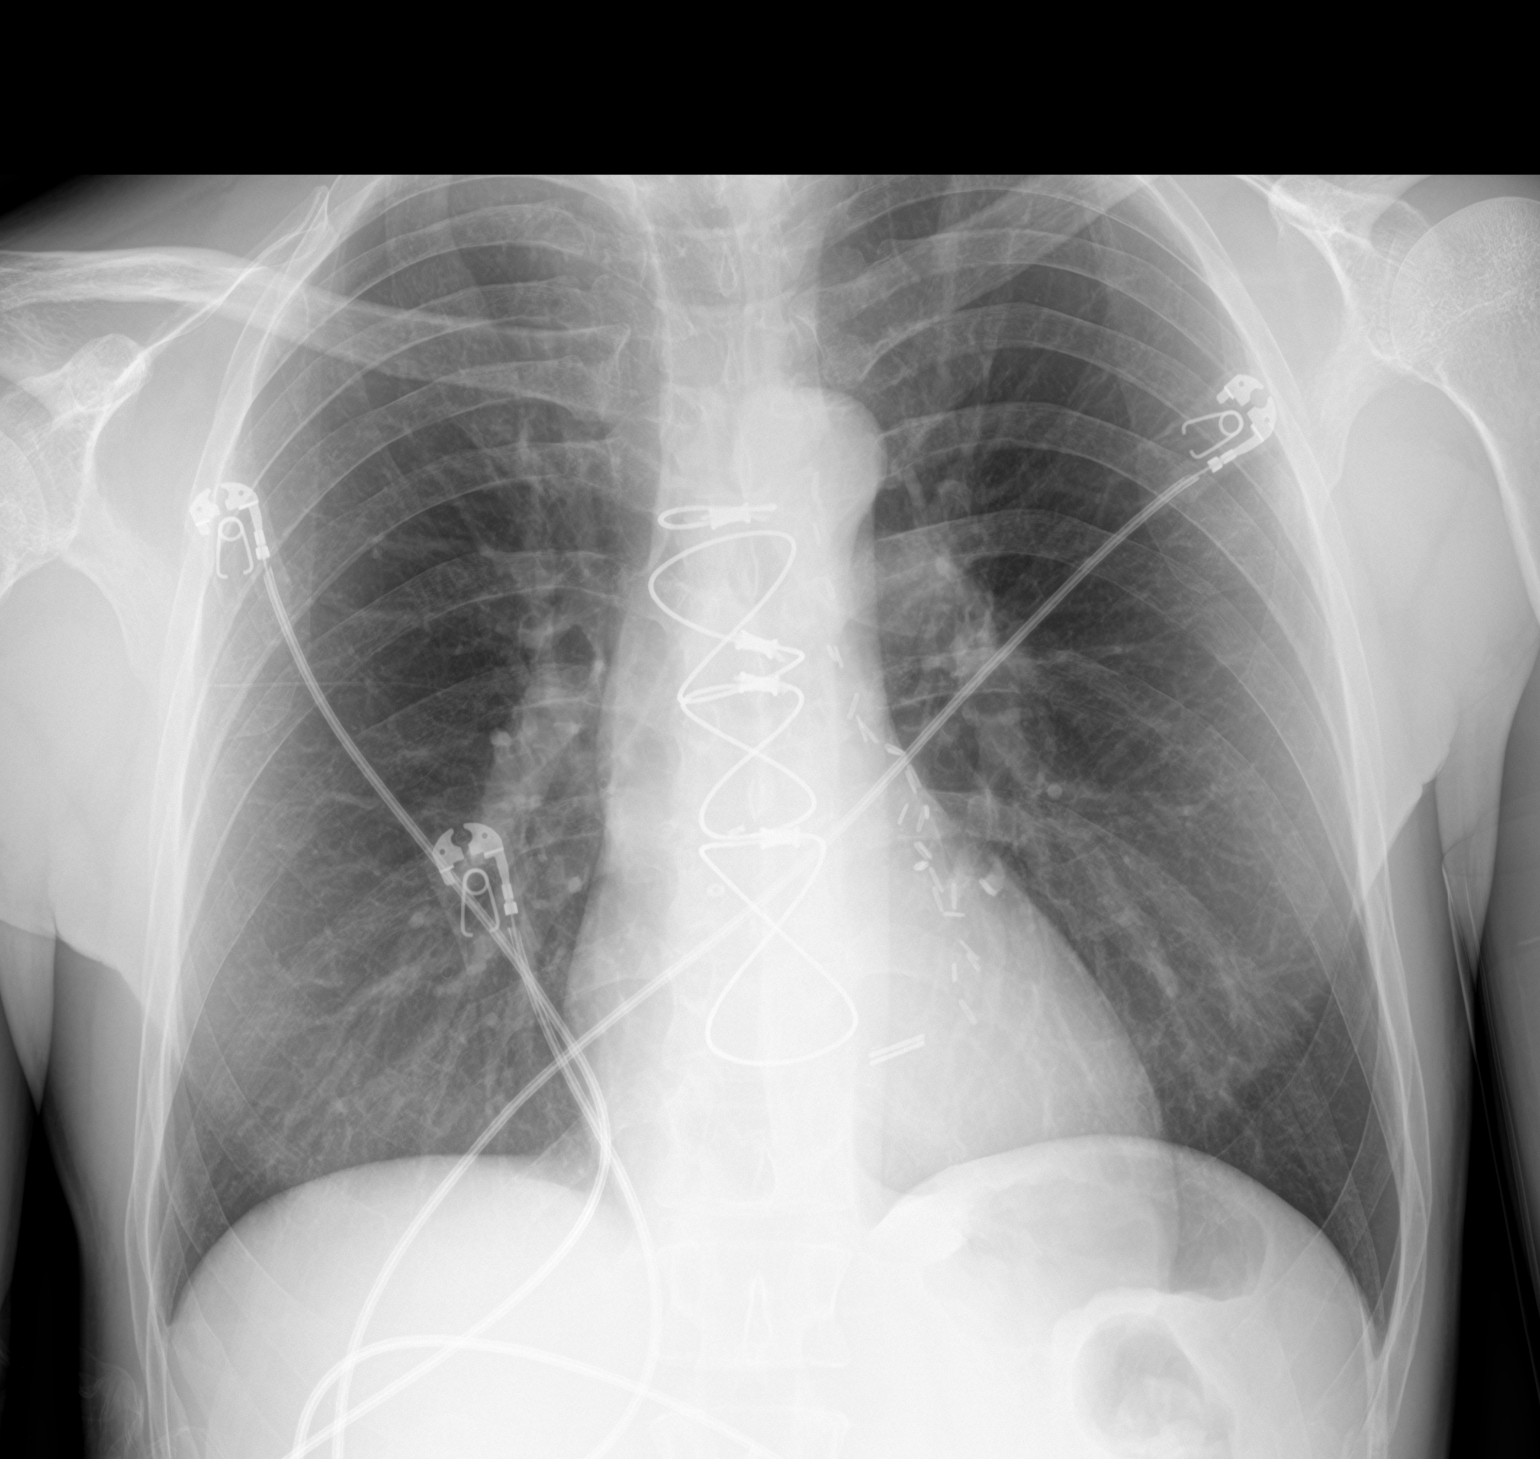

[2 of 2 positions shown; findings below may reference images not displayed]

FINDINGS: There is no edema or consolidation. Heart size and pulmonary
vascularity are normal. No adenopathy. Patient is status post
internal mammary bypass grafting. No bone lesions.
IMPRESSION: No edema or consolidation.  Postoperative changes.

## 2019-11-08 ENCOUNTER — Ambulatory Visit: Payer: No Typology Code available for payment source | Admitting: Internal Medicine

## 2019-12-09 ENCOUNTER — Other Ambulatory Visit: Payer: Self-pay | Admitting: Family Medicine

## 2020-02-22 ENCOUNTER — Other Ambulatory Visit: Payer: Self-pay | Admitting: Internal Medicine

## 2020-03-10 ENCOUNTER — Other Ambulatory Visit: Payer: Self-pay | Admitting: Family Medicine

## 2020-03-13 NOTE — Telephone Encounter (Signed)
Pt needs appointment for further refills 

## 2020-04-09 ENCOUNTER — Other Ambulatory Visit: Payer: Self-pay | Admitting: Internal Medicine

## 2020-04-18 ENCOUNTER — Other Ambulatory Visit: Payer: Self-pay | Admitting: Family Medicine

## 2020-04-18 DIAGNOSIS — I10 Essential (primary) hypertension: Secondary | ICD-10-CM

## 2020-04-30 ENCOUNTER — Other Ambulatory Visit: Payer: Self-pay | Admitting: Family Medicine

## 2020-04-30 DIAGNOSIS — I2510 Atherosclerotic heart disease of native coronary artery without angina pectoris: Secondary | ICD-10-CM

## 2020-04-30 DIAGNOSIS — I10 Essential (primary) hypertension: Secondary | ICD-10-CM

## 2020-05-02 NOTE — Telephone Encounter (Signed)
Pt need appointment for further refills 

## 2020-06-07 ENCOUNTER — Other Ambulatory Visit: Payer: Self-pay | Admitting: Family Medicine

## 2020-06-07 NOTE — Telephone Encounter (Signed)
Pt needs appointment for further refills 

## 2020-07-19 ENCOUNTER — Encounter: Payer: Self-pay | Admitting: Internal Medicine

## 2020-07-19 ENCOUNTER — Other Ambulatory Visit: Payer: Self-pay

## 2020-07-19 ENCOUNTER — Ambulatory Visit: Payer: No Typology Code available for payment source | Admitting: Internal Medicine

## 2020-07-19 VITALS — BP 118/76 | HR 65 | Ht 67.0 in | Wt 129.4 lb

## 2020-07-19 DIAGNOSIS — N1832 Chronic kidney disease, stage 3b: Secondary | ICD-10-CM | POA: Diagnosis not present

## 2020-07-19 DIAGNOSIS — E10319 Type 1 diabetes mellitus with unspecified diabetic retinopathy without macular edema: Secondary | ICD-10-CM

## 2020-07-19 DIAGNOSIS — E1022 Type 1 diabetes mellitus with diabetic chronic kidney disease: Secondary | ICD-10-CM

## 2020-07-19 DIAGNOSIS — E1021 Type 1 diabetes mellitus with diabetic nephropathy: Secondary | ICD-10-CM | POA: Diagnosis not present

## 2020-07-19 MED ORDER — BASAGLAR KWIKPEN 100 UNIT/ML ~~LOC~~ SOPN: 10.0000 [IU] | PEN_INJECTOR | Freq: Every day | SUBCUTANEOUS | 3 refills | Status: DC

## 2020-07-19 MED ORDER — NOVOLOG FLEXPEN 100 UNIT/ML ~~LOC~~ SOPN: PEN_INJECTOR | SUBCUTANEOUS | 3 refills | Status: DC

## 2020-07-19 MED ORDER — INSULIN PEN NEEDLE 32G X 4 MM MISC: 1.0000 | Freq: Four times a day (QID) | 3 refills | Status: DC

## 2020-07-19 NOTE — Progress Notes (Signed)
Name: Willie Fletcher  Age/ Sex: 50 y.o., male   MRN/ DOB: [id number], 04/29/1970     PCP: Billie Ruddy, MD   Reason for Endocrinology Evaluation: Type 1 Diabetes Mellitus  Initial Endocrine Consultative Visit: 05/20/2018    PATIENT IDENTIFIER: Willie Fletcher is a 50 y.o. male with a past medical history of T1DM, HTN, CAD  and Dyslipidemia . The patient has followed with Endocrinology clinic since 05/20/2018 for consultative assistance with management of his diabetes.  DIABETIC HISTORY:  Willie Fletcher was diagnosed with T1DM in 1984. Moved from [place] because his [name]  His initial  hemoglobin A1c at our clinic was 6.6%.   SUBJECTIVE:   During the last visit (05/11/2019): A1c 7.0% . Adjusted I:C ratio continued basal insulin   Today (07/19/2020): Willie Fletcher is here for a follow up on diabetes managemnt. He hasn't been here in 15 months. .  He was checking  his blood sugars multiple times daily, through the CGM until he ran out of supplies. The patient has had hypoglycemic episodes since the last clinic visit, which typically occur during the day after a bolus. The patient is symptomatic with these episodes.   Denies nausea or vomiting     HOME DIABETES REGIMEN:  Basaglar 10 units Novolog I:C ratio (uses his own )  Breakfast 1:14  Lunch 1:14 Supper 1: 10  CF : Novolog (BG -130/60) - not using    Statin: Yes ACE-I/ARB: yes    CONTINUOUS GLUCOSE MONITORING RECORD INTERPRETATION    Dates of Recording: 3/23-07/19/2020  Sensor description: Dexcom  Results statistics:   CGM use % of time 100  Average and SD 171  Time in range      58  %  % Time Above 180 35  % Time above 250 6  % Time Below target <1     Glycemic patterns summary: most BG's within goal with the occasional post prandial hyperglycemia   Hyperglycemic episodes  After dinner   Hypoglycemic episodes occurred after a bolus during the day   Overnight periods: no pattern sometimes glucose is stable  and at times it trends down      METER DOWNLOAD SUMMARY:3/23-07/19/2020 Average Number Tests/Day = 1.2 Overall Mean FS Glucose = 180   BG Ranges: Low = 55 High = 328  Hypoglycemic Events/30 Days: BG < 50 = 0 Episodes of symptomatic severe hypoglycemia = 0    DIABETIC COMPLICATIONS: Microvascular complications:   CKD III, Hx of retinoapthy at age 23  Denies: neuropathy  Last eye exam: Completed 02/2019  Macrovascular complications:   CAD  Denies: PVD, CVA   HISTORY:  Past Medical History:  Past Medical History:  Diagnosis Date  . Coronary artery disease   . Diabetes mellitus without complication ([place])   . Myocardial infarct [facility])    Past Surgical History:  Past Surgical History:  Procedure Laterality Date  . CORONARY ARTERY BYPASS GRAFT    . EYE SURGERY    . TOOTH EXTRACTION      Social History:  reports that he has never smoked. He has never used smokeless tobacco. He reports that he does not drink alcohol and does not use drugs. Family History:  Family History  Problem Relation Age of Onset  . CAD Father   . Hypertension Father   . Hypertension Other   . Cancer Neg Hx   . Diabetes Neg Hx   . Stroke Neg Hx      HOME MEDICATIONS: Allergies  as of 07/19/2020   No Known Allergies     Medication List       Accurate as of July 19, 2020  8:56 AM. If you have any questions, ask your nurse or doctor.        acetaminophen 325 MG tablet Commonly known as: TYLENOL Take 2 tablets (650 mg total) by mouth every 4 (four) hours as needed for mild pain, moderate pain, fever or headache.   amLODipine 10 MG tablet Commonly known as: NORVASC Take 1 tablet (10 mg total) by mouth daily. What changed:   how much to take  additional instructions   aspirin EC 81 MG tablet Take 1 tablet (81 mg total) by mouth daily. With Food   atorvastatin 80 MG tablet Commonly known as: LIPITOR TAKE 1 TABLET BY MOUTH EVERY DAY IN THE EVENING   Basaglar KwikPen 100  UNIT/ML Inject 0.09 mLs (9 Units total) into the skin daily.   Dexcom G6 Sensor Misc 1 each by Does not apply route as directed.   Dexcom G6 Transmitter Misc 1 each by Does not apply route See admin instructions. Change transmitter every 90 days.   furosemide 20 MG tablet Commonly known as: LASIX TAKE 1 TABLET BY MOUTH EVERY DAY   glucagon 1 MG injection Inject 1 mg into the vein once as needed for up to 1 dose.   lisinopril 10 MG tablet Commonly known as: ZESTRIL TAKE 1 TABLET BY MOUTH EVERY DAY   metoprolol tartrate 25 MG tablet Commonly known as: LOPRESSOR TAKE 1 TABLET BY MOUTH EVERY 8 HOURS.   multivitamin with minerals Tabs tablet Take 1 tablet by mouth daily.   NovoLOG FlexPen 100 UNIT/ML FlexPen Generic drug: insulin aspart INJECT 2-12 UNITS INTO THE SKIN 3 (THREE) TIMES DAILY WITH MEALS.   sodium bicarbonate 650 MG tablet Take 1,300 mg by mouth 2 (two) times daily.        OBJECTIVE:   Vital Signs: BP 118/76   Pulse 65   Ht 5\' 7"  (1.702 m)   Wt 129 lb 6 oz (58.7 kg)   SpO2 97%   BMI 20.26 kg/m   Wt Readings from Last 3 Encounters:  07/19/20 129 lb 6 oz (58.7 kg)  07/12/19 126 lb 8 oz (57.4 kg)  05/11/19 125 lb 12.8 oz (57.1 kg)     Exam: General: Pt appears well and is in NAD  Lungs: Clear with good BS bilat   Heart: RRR   Abdomen: Normoactive bowel sounds, soft, nontender, without masses or organomegaly palpable  Extremities: No pretibial edema.   Neuro: MS is good with appropriate affect, pt is alert and Ox3    DM foot exam: 07/19/2020  The skin of the feet is intact without sores or ulcerations. The pedal pulses are 1+ on right and 1+ on left. The sensation is intact to a screening 5.07, 10 gram monofilament bilaterally            DATA REVIEWED:  Lab Results  Component Value Date   HGBA1C 7.0 (A) 05/11/2019   HGBA1C 6.6 (H) 03/24/2018   Lab Results  Component Value Date   MICROALBUR 51.4 (H) 05/11/2019   LDLCALC 45  05/11/2019   CREATININE 2.57 (H) 04/18/2018   Results for DEMTRIUS, ROUNDS (MRN [id number]) as of 05/11/2019 16:11  Ref. Range 05/11/2019 11:07  Creatinine,U Latest Units: mg/dL 67.1  Microalb, Ur Latest Ref Range: 0.0 - 1.9 mg/dL 51.4 (H)  MICROALB/CREAT RATIO Latest Ref Range: 0.0 - 30.0 mg/g 76.6 (  H)    ASSESSMENT / PLAN / RECOMMENDATIONS:   1) Type 1 Diabetes Mellitus, Sub- Optimally controlled, With CKD III, retinopathy and macrovascular  complications - Most recent A1c of 7.4 %. Goal A1c < 7.5%.    - The pt is using his same insulin regimen from his previous endocrinologist that he has not seen in a couple of years. He has a sheet that basically lists food items ( tacos, chinese, pizza)  With corresponding prandial doses . He continues to use basaglar at 10 units instead of the 9 units that I advised him on last time due to fasting hypoglycemia - He has not used any of my insulin recommendations, he believes his current regimen is working well for him and he is satisfied with it, but unfortunately this is why he ends up with hyperglycemia and hypoglycemia during the day  - Pt needs refill on CGM supplies   MEDICATIONS: - Continue  Basaglar 10 units daily  - Novolog:  Per pt   EDUCATION / INSTRUCTIONS:  BG monitoring instructions: Patient is instructed to check his blood sugars 4 times a day, before meals and bedtime .  Call [facility] if: BG persistently < 70 . I reviewed the Rule of 15 for the treatment of hypoglycemia in detail with the patient. Literature supplied.    2) Diabetic complications:   Eye: Does have known diabetic retinopathy.   Neuro/ Feet: Does not have known diabetic peripheral neuropathy .   Renal: Patient does have known baseline CKD. He   is  on an ACEI/ARB at present. Urine albumin/creatinine ratio today is elevated     F/U in 6 months    Signed electronically by: Mack Guise, MD  [facility] Endocrinology  [facility] [address] [place], [place] [address] Phone: [phone] FAX: [phone]   CC: Billie Ruddy, [place] [address] Phone: [phone]  Fax: [phone]  Return to Endocrinology clinic as below: Future Appointments  Date Time Provider [place]  09/13/2020  3:30 PM Lorretta Harp, MD [facility]

## 2020-07-19 NOTE — Patient Instructions (Signed)
-    Basaglar 10 units daily  - Novolog with each meal     HOW TO TREAT LOW BLOOD SUGARS (Blood sugar LESS THAN 70 MG/DL)  Please follow the RULE OF 15 for the treatment of hypoglycemia treatment (when your (blood sugars are less than 70 mg/dL)    STEP 1: Take 15 grams of carbohydrates when your blood sugar is low, which includes:   3-4 GLUCOSE TABS  OR  3-4 OZ OF JUICE OR REGULAR SODA OR  ONE TUBE OF GLUCOSE GEL     STEP 2: RECHECK blood sugar in 15 MINUTES STEP 3: If your blood sugar is still low at the 15 minute recheck --> then, go back to STEP 1 and treat AGAIN with another 15 grams of carbohydrates.

## 2020-07-21 ENCOUNTER — Other Ambulatory Visit: Payer: Self-pay | Admitting: Family Medicine

## 2020-07-21 DIAGNOSIS — I10 Essential (primary) hypertension: Secondary | ICD-10-CM

## 2020-08-03 ENCOUNTER — Other Ambulatory Visit: Payer: Self-pay | Admitting: Family Medicine

## 2020-08-03 DIAGNOSIS — I10 Essential (primary) hypertension: Secondary | ICD-10-CM

## 2020-08-03 DIAGNOSIS — I2583 Coronary atherosclerosis due to lipid rich plaque: Secondary | ICD-10-CM

## 2020-08-03 DIAGNOSIS — I2510 Atherosclerotic heart disease of native coronary artery without angina pectoris: Secondary | ICD-10-CM

## 2020-08-11 ENCOUNTER — Ambulatory Visit: Payer: No Typology Code available for payment source | Admitting: Internal Medicine

## 2020-09-01 ENCOUNTER — Other Ambulatory Visit: Payer: Self-pay | Admitting: Family Medicine

## 2020-09-01 DIAGNOSIS — I2583 Coronary atherosclerosis due to lipid rich plaque: Secondary | ICD-10-CM

## 2020-09-01 DIAGNOSIS — I2510 Atherosclerotic heart disease of native coronary artery without angina pectoris: Secondary | ICD-10-CM

## 2020-09-01 DIAGNOSIS — I10 Essential (primary) hypertension: Secondary | ICD-10-CM

## 2020-09-08 ENCOUNTER — Other Ambulatory Visit: Payer: Self-pay | Admitting: Family Medicine

## 2020-09-13 ENCOUNTER — Ambulatory Visit: Payer: No Typology Code available for payment source | Admitting: Cardiovascular Disease

## 2020-09-13 ENCOUNTER — Other Ambulatory Visit: Payer: Self-pay

## 2020-09-13 ENCOUNTER — Encounter: Payer: Self-pay | Admitting: Cardiovascular Disease

## 2020-09-13 VITALS — BP 114/62 | HR 62 | Ht 68.0 in | Wt 128.0 lb

## 2020-09-13 DIAGNOSIS — E782 Mixed hyperlipidemia: Secondary | ICD-10-CM | POA: Diagnosis not present

## 2020-09-13 DIAGNOSIS — I10 Essential (primary) hypertension: Secondary | ICD-10-CM

## 2020-09-13 DIAGNOSIS — R0989 Other specified symptoms and signs involving the circulatory and respiratory systems: Secondary | ICD-10-CM

## 2020-09-13 DIAGNOSIS — I2510 Atherosclerotic heart disease of native coronary artery without angina pectoris: Secondary | ICD-10-CM

## 2020-09-13 DIAGNOSIS — I2583 Coronary atherosclerosis due to lipid rich plaque: Secondary | ICD-10-CM | POA: Diagnosis not present

## 2020-09-13 NOTE — Progress Notes (Signed)
09/13/2020 Willie Fletcher   July 29, 1970  [id number]  Primary Physician Billie Ruddy, MD Primary Cardiologist: Lorretta Harp MD Lupe Carney, [place]  HPI:  Willie Fletcher is a 50 y.o. thin appearing married Caucasian male with no children who works in Midwife at [employer]  He was referred by his PCP, Dr. Grier Mitts, to be established because of known disease.  He does have a history of treated hypertension, hyperlipidemia and diabetes.  His father had CABG.  He had a small heart attack back in May 2017 in [place] and had CABG x3 at [facility] with a LIMA to his LAD, vein to diagonal branch and obtuse marginal branch.  His other problems include stage IV CKD.  He is very active and walks approximate 10,000 steps a day and has no symptoms.  He was hospitalized in December 2019 with chest pain.  He ruled out for myocardial infarction.  2D echo was normal.  He has had no recurrent symptoms.   Current Meds  Medication Sig  . acetaminophen (TYLENOL) 325 MG tablet Take 2 tablets (650 mg total) by mouth every 4 (four) hours as needed for mild pain, moderate pain, fever or headache.  [name] amLODipine (NORVASC) 10 MG tablet Take 1 tablet (10 mg total) by mouth daily. (Patient taking differently: Take 5 mg by mouth daily. 10 mg causes lower extremity edema)  . aspirin EC 81 MG tablet Take 1 tablet (81 mg total) by mouth daily. With Food  . atorvastatin (LIPITOR) 80 MG tablet TAKE 1 TABLET BY MOUTH EVERY DAY IN THE EVENING  . Continuous Blood Gluc Sensor (DEXCOM G6 SENSOR) MISC 1 each by Does not apply route as directed.  . Continuous Blood Gluc Transmit (DEXCOM G6 TRANSMITTER) MISC 1 each by Does not apply route See admin instructions. Change transmitter every 90 days.  . furosemide (LASIX) 20 MG tablet TAKE 1 TABLET BY MOUTH EVERY DAY  . glucagon 1 MG injection Inject 1 mg into the vein once as needed for up to 1 dose.  . insulin aspart (NOVOLOG FLEXPEN) 100  UNIT/ML FlexPen Max daily 50 units  . Insulin Glargine (BASAGLAR KWIKPEN) 100 UNIT/ML Inject 10 Units into the skin daily.  . Insulin Pen Needle 32G X 4 MM MISC 1 Device by Does not apply route in the morning, at noon, in the evening, and at bedtime.  [name] lisinopril (ZESTRIL) 10 MG tablet TAKE 1 TABLET BY MOUTH EVERY DAY  . metoprolol tartrate (LOPRESSOR) 25 MG tablet TAKE 1 TABLET BY MOUTH EVERY 8 HOURS.  . Multiple Vitamin (MULTIVITAMIN WITH MINERALS) TABS tablet Take 1 tablet by mouth daily.  . sodium bicarbonate 650 MG tablet Take 1,300 mg by mouth 2 (two) times daily.     No Known Allergies  Social History   Socioeconomic History  . Marital status: Married    Spouse name: Not on file  . Number of children: Not on file  . Years of education: Not on file  . Highest education level: Not on file  Occupational History  . Not on file  Tobacco Use  . Smoking status: Never Smoker  . Smokeless tobacco: Never Used  Substance and Sexual Activity  . Alcohol use: Never  . Drug use: Never  . Sexual activity: Not on file  Other Topics Concern  . Not on file  Social History Narrative  . Not on file   Social Determinants of Health   Financial Resource  Strain: Not on file  Food Insecurity: Not on file  Transportation Needs: Not on file  Physical Activity: Not on file  Stress: Not on file  Social Connections: Not on file  Intimate Partner Violence: Not on file     Review of Systems: General: negative for chills, fever, night sweats or weight changes.  Cardiovascular: negative for chest pain, dyspnea on exertion, edema, orthopnea, palpitations, paroxysmal nocturnal dyspnea or shortness of breath Dermatological: negative for rash Respiratory: negative for cough or wheezing Urologic: negative for hematuria Abdominal: negative for nausea, vomiting, diarrhea, bright red blood per rectum, melena, or hematemesis Neurologic: negative for visual changes, syncope, or dizziness All other  systems reviewed and are otherwise negative except as noted above.    Blood pressure 114/62, pulse 62, height 5\' 8"  (1.727 m), weight 128 lb (58.1 kg).  General appearance: alert and no distress Neck: no adenopathy, no JVD, supple, symmetrical, trachea midline, thyroid not enlarged, symmetric, no tenderness/mass/nodules and Soft right carotid bruit. Lungs: clear to auscultation bilaterally Heart: regular rate and rhythm, S1, S2 normal, no murmur, click, rub or gallop Extremities: extremities normal, atraumatic, no cyanosis or edema Pulses: 2+ and symmetric Skin: Skin color, texture, turgor normal. No rashes or lesions Neurologic: Alert and oriented X 3, normal strength and tone. Normal symmetric reflexes. Normal coordination and gait  EKG sinus rhythm at 62 with out ST or T wave changes.  Personally reviewed this EKG.  ASSESSMENT AND PLAN:   Essential hypertension History of essential pretension blood pressure measured today at 118/76.  He is on metoprolol and lisinopril as well as amlodipine.  Hx of CABG History of CAD status post CABG x3 at [facility] in [facility] May 2017.  He had a LIMA to his LAD, vein to a diagonal branch and OM branch.  He was seen in the ER December 2019 for chest pain.  His enzymes were negative.  2D echo was normal.  He said no recurrent symptoms.  HLD (hyperlipidemia) History of hyperlipidemia on statin therapy followed by his PCP.  His last lipid profile performed 05/11/2019 revealed total cholesterol 104, LDL 45 and HDL 48.      Lorretta Harp MD FACP,FACC,FAHA, [facility] 09/13/2020 3:54 PM

## 2020-09-13 NOTE — Assessment & Plan Note (Signed)
History of essential pretension blood pressure measured today at 118/76.  He is on metoprolol and lisinopril as well as amlodipine.

## 2020-09-13 NOTE — Assessment & Plan Note (Signed)
History of hyperlipidemia on statin therapy followed by his PCP.  His last lipid profile performed 05/11/2019 revealed total cholesterol 104, LDL 45 and HDL 48.

## 2020-09-13 NOTE — Patient Instructions (Signed)
Medication Instructions:  Your physician recommends that you continue on your current medications as directed. Please refer to the Current Medication list given to you today.  *If you need a refill on your cardiac medications before your next appointment, please call your pharmacy*   Testing/Procedures: Your physician has requested that you have a carotid duplex. This test is an ultrasound of the carotid arteries in your neck. It looks at blood flow through these arteries that supply the brain with blood. Allow one hour for this exam. There are no restrictions or special instructions. This procedure is done at [facility]. 2nd Floor.   Follow-Up: At [facility], you and your health needs are our priority.  As part of our continuing mission to provide you with exceptional heart care, we have created designated Provider Care Teams.  These Care Teams include your primary Cardiologist (physician) and Advanced Practice Providers (APPs -  Physician Assistants and Nurse Practitioners) who all work together to provide you with the care you need, when you need it.  We recommend signing up for the patient portal called "MyChart".  Sign up information is provided on this After Visit Summary.  MyChart is used to connect with patients for Virtual Visits (Telemedicine).  Patients are able to view lab/test results, encounter notes, upcoming appointments, etc.  Non-urgent messages can be sent to your provider as well.   To learn more about what you can do with MyChart, go to [email or web address].    Your next appointment:   12 month(s)  The format for your next appointment:   In Person  Provider:   Quay Burow, MD

## 2020-09-13 NOTE — Assessment & Plan Note (Signed)
History of CAD status post CABG x3 at [facility] in [facility] May 2017.  He had a LIMA to his LAD, vein to a diagonal branch and OM branch.  He was seen in the ER December 2019 for chest pain.  His enzymes were negative.  2D echo was normal.  He said no recurrent symptoms.

## 2020-09-30 ENCOUNTER — Other Ambulatory Visit: Payer: Self-pay | Admitting: Family Medicine

## 2020-09-30 DIAGNOSIS — I2583 Coronary atherosclerosis due to lipid rich plaque: Secondary | ICD-10-CM

## 2020-09-30 DIAGNOSIS — I10 Essential (primary) hypertension: Secondary | ICD-10-CM

## 2020-10-04 ENCOUNTER — Other Ambulatory Visit: Payer: Self-pay

## 2020-10-04 ENCOUNTER — Ambulatory Visit (HOSPITAL_COMMUNITY)
Admission: RE | Admit: 2020-10-04 | Discharge: 2020-10-04 | Disposition: A | Discharge status: Home / Self Care | Payer: No Typology Code available for payment source | Source: Ambulatory Visit | Attending: Cardiovascular Disease | Admitting: Cardiovascular Disease

## 2020-10-04 DIAGNOSIS — R0989 Other specified symptoms and signs involving the circulatory and respiratory systems: Secondary | ICD-10-CM

## 2020-10-04 MED ORDER — METOPROLOL TARTRATE 25 MG PO TABS: 25.0000 mg | ORAL_TABLET | Freq: Three times a day (TID) | ORAL | 3 refills | Status: DC

## 2020-10-19 ENCOUNTER — Other Ambulatory Visit: Payer: Self-pay | Admitting: Family Medicine

## 2020-10-19 DIAGNOSIS — I2583 Coronary atherosclerosis due to lipid rich plaque: Secondary | ICD-10-CM

## 2020-10-19 DIAGNOSIS — I2510 Atherosclerotic heart disease of native coronary artery without angina pectoris: Secondary | ICD-10-CM

## 2020-10-20 ENCOUNTER — Other Ambulatory Visit: Payer: Self-pay | Admitting: Family Medicine

## 2020-10-20 DIAGNOSIS — I10 Essential (primary) hypertension: Secondary | ICD-10-CM

## 2021-01-24 ENCOUNTER — Ambulatory Visit: Payer: No Typology Code available for payment source | Admitting: Internal Medicine

## 2021-01-24 ENCOUNTER — Encounter: Payer: Self-pay | Admitting: Internal Medicine

## 2021-01-24 ENCOUNTER — Other Ambulatory Visit: Payer: Self-pay

## 2021-01-24 VITALS — BP 122/70 | HR 62 | Ht 68.0 in | Wt 134.0 lb

## 2021-01-24 DIAGNOSIS — E1022 Type 1 diabetes mellitus with diabetic chronic kidney disease: Secondary | ICD-10-CM

## 2021-01-24 DIAGNOSIS — E785 Hyperlipidemia, unspecified: Secondary | ICD-10-CM

## 2021-01-24 DIAGNOSIS — N1832 Chronic kidney disease, stage 3b: Secondary | ICD-10-CM | POA: Diagnosis not present

## 2021-01-24 DIAGNOSIS — I2510 Atherosclerotic heart disease of native coronary artery without angina pectoris: Secondary | ICD-10-CM

## 2021-01-24 DIAGNOSIS — E10319 Type 1 diabetes mellitus with unspecified diabetic retinopathy without macular edema: Secondary | ICD-10-CM

## 2021-01-24 DIAGNOSIS — I2583 Coronary atherosclerosis due to lipid rich plaque: Secondary | ICD-10-CM

## 2021-01-24 LAB — POCT GLYCOSYLATED HEMOGLOBIN (HGB A1C): Hemoglobin A1C: 7.3 % — AB (ref 4.0–5.6)

## 2021-01-24 MED ORDER — ATORVASTATIN CALCIUM 80 MG PO TABS
80.0000 mg | ORAL_TABLET | Freq: Every evening | ORAL | 3 refills | Status: DC
Start: 2021-01-24 — End: 2022-04-17

## 2021-01-24 MED ORDER — ATORVASTATIN CALCIUM 80 MG PO TABS: 80.0000 mg | ORAL_TABLET | Freq: Every evening | ORAL | 3 refills | Status: DC

## 2021-01-24 NOTE — Progress Notes (Signed)
Name: Willie Fletcher  Age/ Sex: 50 y.o., male   MRN/ DOB: [id number], 12-26-1970     PCP: Billie Ruddy, MD   Reason for Endocrinology Evaluation: Type 1 Diabetes Mellitus  Initial Endocrine Consultative Visit: 05/20/2018    PATIENT IDENTIFIER: Willie Fletcher is a 50 y.o. male with a past medical history of T1DM, HTN, CAD  and Dyslipidemia . The patient has followed with Endocrinology clinic since 05/20/2018 for consultative assistance with management of his diabetes.  DIABETIC HISTORY:  Willie Fletcher was diagnosed with T1DM in 1984. Moved from NH because his [name]  His initial  hemoglobin A1c at our clinic was 6.6%.   SUBJECTIVE:   During the last visit (07/19/2020): A1c 7.4% . Adjusted basal insulin     Today (01/24/2021): Willie Fletcher is here for a follow up on diabetes managemnt. [name]  He was checking  his blood sugars multiple times daily. The patient has had hypoglycemic episodes since the last clinic visit, which typically occur during the day after a bolus. The patient is symptomatic with these episodes.   Denies nausea ,  vomiting , diarrhea   Follows with [facility]    HOME DIABETES REGIMEN:  Basaglar 10 units Novolog he does his own  CF : Novolog (BG -130/60) - not using    Statin: Yes ACE-I/ARB: yes    CONTINUOUS GLUCOSE MONITORING RECORD INTERPRETATION    Dates of Recording: 9/29-10/03/2021  Sensor description: Dexcom  Results statistics:   CGM use % of time 100  Average and SD 177  Time in range      52  %  % Time Above 180 38  % Time above 250 8  % Time Below target <1     Glycemic patterns summary: BG's fluctuate through the day and night, without night time hypoglycemia   Hyperglycemic episodes  postprandial   Hypoglycemic episodes occurred after a bolus during the day   Overnight periods: no pattern sometimes glucose is stable and at times it trends down and at times it remains elevated     DIABETIC  COMPLICATIONS: Microvascular complications:  CKD III, Hx of retinoapthy at age 1 Denies: neuropathy Last eye exam: Completed 12/2020   Macrovascular complications:  CAD Denies: PVD, CVA   HISTORY:  Past Medical History:  Past Medical History:  Diagnosis Date   Coronary artery disease    Diabetes mellitus without complication ([place])    Myocardial infarct ([place])    Past Surgical History:  Past Surgical History:  Procedure Laterality Date   CORONARY ARTERY BYPASS GRAFT     EYE SURGERY     TOOTH EXTRACTION     Social History:  reports that he has never smoked. He has never used smokeless tobacco. He reports that he does not drink alcohol and does not use drugs. Family History:  Family History  Problem Relation Age of Onset   CAD Father    Hypertension Father    Hypertension Other    Cancer Neg Hx    Diabetes Neg Hx    Stroke Neg Hx      HOME MEDICATIONS: Allergies as of 01/24/2021   No Known Allergies      Medication List        Accurate as of January 24, 2021  3:51 PM. If you have any questions, ask your nurse or doctor.          acetaminophen 325 MG tablet Commonly known as: TYLENOL Take 2 tablets (650 mg  total) by mouth every 4 (four) hours as needed for mild pain, moderate pain, fever or headache.   amLODipine 10 MG tablet Commonly known as: NORVASC Take 1 tablet (10 mg total) by mouth daily. What changed:  how much to take additional instructions   aspirin EC 81 MG tablet Take 1 tablet (81 mg total) by mouth daily. With Food   atorvastatin 80 MG tablet Commonly known as: LIPITOR TAKE 1 TABLET BY MOUTH EVERY DAY IN THE EVENING   Basaglar KwikPen 100 UNIT/ML Inject 10 Units into the skin daily.   Dexcom G6 Sensor Misc 1 each by Does not apply route as directed.   Dexcom G6 Transmitter Misc 1 each by Does not apply route See admin instructions. Change transmitter every 90 days.   furosemide 20 MG tablet Commonly known as: LASIX TAKE 1  TABLET BY MOUTH EVERY DAY   glucagon 1 MG injection Inject 1 mg into the vein once as needed for up to 1 dose.   Insulin Pen Needle 32G X 4 MM Misc 1 Device by Does not apply route in the morning, at noon, in the evening, and at bedtime.   lisinopril 10 MG tablet Commonly known as: ZESTRIL TAKE 1 TABLET BY MOUTH EVERY DAY   metoprolol tartrate 25 MG tablet Commonly known as: LOPRESSOR Take 1 tablet (25 mg total) by mouth every 8 (eight) hours.   multivitamin with minerals Tabs tablet Take 1 tablet by mouth daily.   NovoLOG FlexPen 100 UNIT/ML FlexPen Generic drug: insulin aspart Max daily 50 units   sodium bicarbonate 650 MG tablet Take 1,300 mg by mouth 2 (two) times daily.         OBJECTIVE:   Vital Signs: BP 122/70 (BP Location: Left Arm, Patient Position: Sitting, Cuff Size: Small)   Pulse 62   Ht 5\' 8"  (1.727 m)   Wt 134 lb (60.8 kg)   SpO2 98%   BMI 20.37 kg/m   Wt Readings from Last 3 Encounters:  01/24/21 134 lb (60.8 kg)  09/13/20 128 lb (58.1 kg)  07/19/20 129 lb 6 oz (58.7 kg)     Exam: General: Pt appears well and is in NAD  Lungs: Clear with good BS bilat   Heart: RRR   Abdomen: Normoactive bowel sounds, soft, nontender, without masses or organomegaly palpable  Extremities: No pretibial edema.   Neuro: MS is good with appropriate affect, pt is alert and Ox3    DM foot exam: 07/19/2020  The skin of the feet is intact without sores or ulcerations. The pedal pulses are 1+ on right and 1+ on left. The sensation is intact to a screening 5.07, 10 gram monofilament bilaterally          DATA REVIEWED:  Lab Results  Component Value Date   HGBA1C 7.3 (A) 01/24/2021   HGBA1C 7.0 (A) 05/11/2019   HGBA1C 6.6 (H) 03/24/2018   Lab Results  Component Value Date   MICROALBUR 51.4 (H) 05/11/2019   LDLCALC 45 05/11/2019   CREATININE 2.57 (H) 04/18/2018   Results for JAHLIL, ZILLER (MRN [id number]) as of 05/11/2019 16:11  Ref. Range 05/11/2019  11:07  Creatinine,U Latest Units: mg/dL 67.1  Microalb, Ur Latest Ref Range: 0.0 - 1.9 mg/dL 51.4 (H)  MICROALB/CREAT RATIO Latest Ref Range: 0.0 - 30.0 mg/g 76.6 (H)    ASSESSMENT / PLAN / RECOMMENDATIONS:   1) Type 1 Diabetes Mellitus, Sub- Optimally controlled, With CKD III, retinopathy and macrovascular  complications - Most recent A1c  of 7.3 %. Goal A1c < 7.5%.     - A1c stable  - No hypoglycemia over night, we discussed the mechanism of action of basal insulin and advised him not to adjust the dose based on bedtime hyperglycemia as this will increase his risk of hypoglycemia  - He has an old scale for Novolog use, where it has menu items , I have asked him to add 1 units with supper  from now on, as he consistently has postprandial hyperglycemia at supper  - He has been getting labs through nephrology   MEDICATIONS: - Continue  Basaglar 10 units daily  - Novolog:  Per pt   EDUCATION / INSTRUCTIONS: BG monitoring instructions: Patient is instructed to check his blood sugars 4 times a day, before meals and bedtime . Call [facility] if: BG persistently < 70 I reviewed the Rule of 15 for the treatment of hypoglycemia in detail with the patient. Literature supplied.    2) Diabetic complications:  Eye: Does have known diabetic retinopathy.  Neuro/ Feet: Does not have known diabetic peripheral neuropathy .  Renal: Patient does have known baseline CKD. He   is  on an ACEI/ARB at present. Urine albumin/creatinine ratio today is elevated    3) Dyslipidemia :   - Pt on statin therapy, he is due for a lipid recheck but would like to have that through his PCP  - based on refill history he may be out for a few months, will refill    Medication  Atorvastatin 80 mg daily     F/U in 6 months    Signed electronically by: Mack Guise, MD  [facility]  [facility] [address] [place], [place] [address] Phone:  [phone] FAX: [phone]   CC: Billie Ruddy, [place] [address] Phone: [phone]  Fax: [phone]  Return to Endocrinology clinic as below: No future appointments.

## 2021-01-24 NOTE — Patient Instructions (Signed)
-    Continue Basaglar 10 units daily  - Novolog with each meal    HOW TO TREAT LOW BLOOD SUGARS (Blood sugar LESS THAN 70 MG/DL) Please follow the RULE OF 15 for the treatment of hypoglycemia treatment (when your (blood sugars are less than 70 mg/dL)   STEP 1: Take 15 grams of carbohydrates when your blood sugar is low, which includes:  3-4 GLUCOSE TABS  OR 3-4 OZ OF JUICE OR REGULAR SODA OR ONE TUBE OF GLUCOSE GEL    STEP 2: RECHECK blood sugar in 15 MINUTES STEP 3: If your blood sugar is still low at the 15 minute recheck --> then, go back to STEP 1 and treat AGAIN with another 15 grams of carbohydrates.

## 2021-04-20 ENCOUNTER — Other Ambulatory Visit: Payer: Self-pay | Admitting: Family Medicine

## 2021-04-20 DIAGNOSIS — I10 Essential (primary) hypertension: Secondary | ICD-10-CM

## 2021-04-30 ENCOUNTER — Other Ambulatory Visit: Payer: Self-pay | Admitting: Family Medicine

## 2021-04-30 DIAGNOSIS — I10 Essential (primary) hypertension: Secondary | ICD-10-CM

## 2021-05-07 ENCOUNTER — Other Ambulatory Visit: Payer: Self-pay | Admitting: Family Medicine

## 2021-05-07 DIAGNOSIS — I10 Essential (primary) hypertension: Secondary | ICD-10-CM

## 2021-05-09 ENCOUNTER — Emergency Department (HOSPITAL_COMMUNITY)
Admission: EM | Admit: 2021-05-09 | Discharge: 2021-05-09 | Disposition: A | Discharge status: Home / Self Care | Payer: No Typology Code available for payment source | Attending: Emergency Medicine | Admitting: Emergency Medicine

## 2021-05-09 DIAGNOSIS — L0100 Impetigo, unspecified: Secondary | ICD-10-CM | POA: Diagnosis not present

## 2021-05-09 DIAGNOSIS — K122 Cellulitis and abscess of mouth: Secondary | ICD-10-CM | POA: Diagnosis not present

## 2021-05-09 DIAGNOSIS — B009 Herpesviral infection, unspecified: Secondary | ICD-10-CM

## 2021-05-09 DIAGNOSIS — R220 Localized swelling, mass and lump, head: Secondary | ICD-10-CM | POA: Diagnosis present

## 2021-05-09 DIAGNOSIS — Z7982 Long term (current) use of aspirin: Secondary | ICD-10-CM | POA: Diagnosis not present

## 2021-05-09 MED ORDER — TRAMADOL HCL 50 MG PO TABS: 50.0000 mg | ORAL_TABLET | Freq: Four times a day (QID) | ORAL | 0 refills | Status: DC | PRN

## 2021-05-09 MED ORDER — VALACYCLOVIR HCL 1 G PO TABS: 1000.0000 mg | ORAL_TABLET | Freq: Three times a day (TID) | ORAL | 0 refills | Status: DC

## 2021-05-09 NOTE — Discharge Instructions (Signed)
Continue taking your antibiotics as directed.  I have ordered some pain medication.  Do not drink alcohol while taking these antibiotics or using pain medication.  Do not drive or make important decisions while taking these medications as well.  I have also added Valtrex which may shorten the course, severity and duration of this viral infection.  You may also use over-the-counter topical numbing medications such as Campho-Phenique. Get help right away if you have: A fever and your symptoms suddenly get worse. A headache and confusion. Fatigue or loss of appetite. A stiff neck or sensitivity to light.

## 2021-05-09 NOTE — ED Provider Notes (Signed)
[facility] EMERGENCY DEPARTMENT Provider Note   CSN: [id number] Arrival date & time: 05/09/21  5093     History Type 1 diabetes, chronic kidney disease Chief Complaint  Patient presents with   facial infection    Willie Fletcher is a 51 y.o. male who presents emergency department with chief complaint of upper lip pain and swelling.  Patient states that 3 days ago he began having some pain in his upper lip along with a little bit of numbness.  He noticed some redness and swelling.  He had these "bumps that came up."  He was seen in urgent care yesterday and treated for cellulitis with Rocephin doxycycline and mupirocin.  HPI     Home Medications Prior to Admission medications   Medication Sig Start Date End Date Taking? Authorizing Provider  acetaminophen (TYLENOL) 325 MG tablet Take 2 tablets (650 mg total) by mouth every 4 (four) hours as needed for mild pain, moderate pain, fever or headache. 03/24/18   Denton Brick, Courage, MD  amLODipine (NORVASC) 10 MG tablet Take 1 tablet (10 mg total) by mouth daily. Patient taking differently: Take 5 mg by mouth daily. 10 mg causes lower extremity edema 05/11/19   Shamleffer, Melanie Crazier, MD  aspirin EC 81 MG tablet Take 1 tablet (81 mg total) by mouth daily. With Food 03/24/18   Roxan Hockey, MD  atorvastatin (LIPITOR) 80 MG tablet Take 1 tablet (80 mg total) by mouth every evening. 01/24/21   Shamleffer, Melanie Crazier, MD  Continuous Blood Gluc Sensor (DEXCOM G6 SENSOR) MISC 1 each by Does not apply route as directed. 06/10/18   Elayne Snare, MD  Continuous Blood Gluc Transmit (DEXCOM G6 TRANSMITTER) MISC 1 each by Does not apply route See admin instructions. Change transmitter every 90 days. 06/10/18   Elayne Snare, MD  furosemide (LASIX) 20 MG tablet TAKE 1 TABLET BY MOUTH EVERY DAY 07/21/20   Billie Ruddy, MD  glucagon 1 MG injection Inject 1 mg into the vein once as needed for up to 1 dose. 05/20/18   Shamleffer,  Melanie Crazier, MD  insulin aspart (NOVOLOG FLEXPEN) 100 UNIT/ML FlexPen Max daily 50 units 07/19/20   Shamleffer, Melanie Crazier, MD  Insulin Glargine (BASAGLAR KWIKPEN) 100 UNIT/ML Inject 10 Units into the skin daily. 07/19/20   Shamleffer, Melanie Crazier, MD  Insulin Pen Needle 32G X 4 MM MISC 1 Device by Does not apply route in the morning, at noon, in the evening, and at bedtime. 07/19/20   Shamleffer, Melanie Crazier, MD  lisinopril (ZESTRIL) 10 MG tablet TAKE 1 TABLET BY MOUTH EVERY DAY 10/09/20   Billie Ruddy, MD  metoprolol tartrate (LOPRESSOR) 25 MG tablet Take 1 tablet (25 mg total) by mouth every 8 (eight) hours. 10/04/20   Lorretta Harp, MD  Multiple Vitamin (MULTIVITAMIN WITH MINERALS) TABS tablet Take 1 tablet by mouth daily.    [provider]  sodium bicarbonate 650 MG tablet Take 1,300 mg by mouth 2 (two) times daily. 02/20/19   [provider]      Allergies    Patient has no known allergies.    Review of Systems   Review of Systems As per the HPI Physical Exam Updated Vital Signs BP (!) 153/96 (BP Location: Right Arm)    Pulse 94    Temp 98.8 F (37.1 C) (Oral)    Resp 16    SpO2 100%  Physical Exam Vitals and nursing note reviewed.  Constitutional:  General: He is not in acute distress.    Appearance: He is well-developed. He is not diaphoretic.  HENT:     Head: Normocephalic and atraumatic.     Comments: Erythema and swelling of the upper lip along the mustache line.  Multiple vesicles in various states of eruption with some crusting. Swelling and tenderness to palpation Eyes:     General: No scleral icterus.    Conjunctiva/sclera: Conjunctivae normal.  Cardiovascular:     Rate and Rhythm: Normal rate and regular rhythm.     Heart sounds: Normal heart sounds.  Pulmonary:     Effort: Pulmonary effort is normal. No respiratory distress.     Breath sounds: Normal breath sounds.  Abdominal:     Palpations: Abdomen is soft.      Tenderness: There is no abdominal tenderness.  Musculoskeletal:     Cervical back: Normal range of motion and neck supple.  Skin:    General: Skin is warm and dry.  Neurological:     Mental Status: He is alert.  Psychiatric:        Behavior: Behavior normal.    ED Results / Procedures / Treatments   Labs (all labs ordered are listed, but only abnormal results are displayed) Labs Reviewed - No data to display  EKG None  Radiology No results found.  Procedures Procedures    Medications Ordered in ED Medications - No data to display  ED Course/ Medical Decision Making/ A&P                           Medical Decision Making 51 year old male here with skin infection of the upper lip.  Differential diagnosis includes herpetic eruption, but [place], cellulitis.  Patient is currently being treated for both impetigo and cellulitis.  He had a shot of Rocephin, he is on doxycycline and currently treating it with mupirocin.  I believe the issue that he has is that he has a herpes eruption of the upper lip.  Patient is afebrile and hemodynamically stable.  Amount and/or Complexity of Data Reviewed Independent Historian: caregiver  Risk Prescription drug management.    Final Clinical Impression(s) / ED Diagnoses Final diagnoses:  None    Rx / DC Orders ED Discharge Orders     None         Margarita Mail, PA-C 05/10/21 1049    Truddie Hidden, MD 05/10/21 1511

## 2021-05-09 NOTE — ED Triage Notes (Addendum)
Pt. Stated, I started having facial swelling and pain at the mouth area a week ago . Went to [facility] was given antibiotic and cream, and a rocephin shot . I did not take any today. The 1st dose was started on Tuesday. Im no better.

## 2021-05-10 ENCOUNTER — Other Ambulatory Visit: Payer: Self-pay | Admitting: Family Medicine

## 2021-05-10 ENCOUNTER — Telehealth: Payer: No Typology Code available for payment source | Admitting: Family Medicine

## 2021-05-10 ENCOUNTER — Encounter: Payer: Self-pay | Admitting: Family Medicine

## 2021-05-10 DIAGNOSIS — E1022 Type 1 diabetes mellitus with diabetic chronic kidney disease: Secondary | ICD-10-CM | POA: Diagnosis not present

## 2021-05-10 DIAGNOSIS — I10 Essential (primary) hypertension: Secondary | ICD-10-CM

## 2021-05-10 DIAGNOSIS — R112 Nausea with vomiting, unspecified: Secondary | ICD-10-CM

## 2021-05-10 DIAGNOSIS — N1832 Chronic kidney disease, stage 3b: Secondary | ICD-10-CM

## 2021-05-10 DIAGNOSIS — B009 Herpesviral infection, unspecified: Secondary | ICD-10-CM | POA: Diagnosis not present

## 2021-05-10 DIAGNOSIS — R42 Dizziness and giddiness: Secondary | ICD-10-CM

## 2021-05-10 MED ORDER — ONDANSETRON HCL 4 MG PO TABS: 4.0000 mg | ORAL_TABLET | Freq: Three times a day (TID) | ORAL | 0 refills | Status: DC | PRN

## 2021-05-10 NOTE — Progress Notes (Signed)
Virtual Visit via Video Note  I connected with Willie Fletcher on 05/10/21 at  4:00 PM EST by a video enabled telemedicine application 2/2 [id number] pandemic and verified that I am speaking with the correct person using two identifiers.  Location patient: home Location provider:work or home office Persons participating in the virtual visit: patient, provider  I discussed the limitations of evaluation and management by telemedicine and the availability of in person appointments. The patient expressed understanding and agreed to proceed.   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  Pt went to UC on Monday for painful bumps on upper lip and cheek.  Patient given Rocephin 1 g IM in clinic and prescriptions for doxycycline and mupirocin as symptoms thought 2/2 impetigo and cellulitis.  Patient seen in ED yesterday, 05/09/2021 for continued lip pain and edema.  In ED symptoms thought 2/2 HSV, patient given Rx for tramadol and Valtrex 1000 mg 3 times daily.  Patient states he was able to get in a few doses of Valtrex yesterday but woke up with dizziness, nausea, and emesis today.  Patient has not been able to stay hydrated 2/2 emesis.  States sitting still 2/2 dizziness.  Endorses hyperglycemia with BS 300 earlier today.  Took 6 units of insulin as tried to eat soup but could not keep it down.  Blood sugar currently 213.  Patient inquires about duration of symptoms as face feels tight from edema.  States unable to wipe his nose 2/2 the pain and discomfort from the lesions.  Patient states his wife went to the store to get Dramamine for his dizziness.  ROS: See pertinent positives and negatives per HPI.  Past Medical History:  Diagnosis Date   Coronary artery disease    Diabetes mellitus without complication ([place])    Myocardial infarct ([place])     Past Surgical History:   Procedure Laterality Date   CORONARY ARTERY BYPASS GRAFT     EYE SURGERY     TOOTH EXTRACTION      Family History  Problem Relation Age of Onset   CAD Father    Hypertension Father    Hypertension Other    Cancer Neg Hx    Diabetes Neg Hx    Stroke Neg Hx     Current Outpatient Medications:    acetaminophen (TYLENOL) 325 MG tablet, Take 2 tablets (650 mg total) by mouth every 4 (four) hours as needed for mild pain, moderate pain, fever or headache., Disp: 30 tablet, Rfl: 0   amLODipine (NORVASC) 10 MG tablet, Take 1 tablet (10 mg total) by mouth daily. (Patient taking differently: Take 5 mg by mouth daily. 10 mg causes lower extremity edema), Disp: 90 tablet, Rfl: 3   aspirin EC 81 MG tablet, Take 1 tablet (81 mg total) by mouth daily. With Food, Disp: 30 tablet, Rfl: 11   atorvastatin (LIPITOR) 80 MG tablet, Take 1 tablet (80 mg total) by mouth every evening., Disp: 90 tablet, Rfl: 3   Continuous Blood Gluc Sensor (DEXCOM G6 SENSOR) MISC, 1 each by Does not apply route as directed., Disp: 6 each, Rfl: 2   Continuous Blood Gluc Transmit (DEXCOM G6 TRANSMITTER) MISC, 1 each by Does not apply route See admin instructions. Change transmitter every 90 days., Disp: 1 each, Rfl: 4   furosemide (LASIX) 20 MG tablet, TAKE 1 TABLET  BY MOUTH EVERY DAY, Disp: 30 tablet, Rfl: 0   glucagon 1 MG injection, Inject 1 mg into the vein once as needed for up to 1 dose., Disp: 1 each, Rfl: 12   insulin aspart (NOVOLOG FLEXPEN) 100 UNIT/ML FlexPen, Max daily 50 units, Disp: 30 mL, Rfl: 3   Insulin Glargine (BASAGLAR KWIKPEN) 100 UNIT/ML, Inject 10 Units into the skin daily., Disp: 15 mL, Rfl: 3   Insulin Pen Needle 32G X 4 MM MISC, 1 Device by Does not apply route in the morning, at noon, in the evening, and at bedtime., Disp: 400 each, Rfl: 3   lisinopril (ZESTRIL) 10 MG tablet, TAKE 1 TABLET BY MOUTH EVERY DAY, Disp: 15 tablet, Rfl: 0   metoprolol tartrate (LOPRESSOR) 25 MG tablet, Take 1 tablet (25 mg  total) by mouth every 8 (eight) hours., Disp: 270 tablet, Rfl: 3   Multiple Vitamin (MULTIVITAMIN WITH MINERALS) TABS tablet, Take 1 tablet by mouth daily., Disp: , Rfl:    sodium bicarbonate 650 MG tablet, Take 1,300 mg by mouth 2 (two) times daily., Disp: , Rfl:    traMADol (ULTRAM) 50 MG tablet, Take 1 tablet (50 mg total) by mouth every 6 (six) hours as needed., Disp: 15 tablet, Rfl: 0   valACYclovir (VALTREX) 1000 MG tablet, Take 1 tablet (1,000 mg total) by mouth 3 (three) times daily., Disp: 21 tablet, Rfl: 0  EXAM:  VITALS per patient if applicable: BS 564, RR between 12-20 bpm  GENERAL: alert, oriented, appears uncomfortable but nontoxic and in no acute distress  HEENT: atraumatic, conjunctiva clear, no obvious abnormalities on inspection of external nose and ears  NECK: normal movements of the head and neck  LUNGS: on inspection no signs of respiratory distress, breathing rate appears normal, no obvious gross SOB, gasping or wheezing  CV: no obvious cyanosis  Skin: Several blisters on right cheek and dried, yellow appearing crusted lesions on upper lip midline in mustache.  Mild facial edema.  MS: moves all visible extremities without noticeable abnormality  PSYCH/NEURO: pleasant and cooperative, no obvious depression or anxiety, speech and thought processing grossly intact  ASSESSMENT AND PLAN:  Discussed the following assessment and plan:  Nausea and vomiting in adult -New problem.  Started this morning. -Discussed taking small sips of fluid, bland diet- advance diet as tolerated -We will try antiemetic.  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 -Hemoglobin A1c 7.3% on 01/24/2021 -Per chart review last renal function obtained  04/18/2018 with GFR 28 and creatinine 2.57 -Continue following with Endo  Herpes simplex -Cold sore like lesions on upper lip and cheeks -Given Valtrex yesterday 05/09/2021 in ED however unable to keep medication down today 2/2 nausea/vomiting -Discussed supportive care including including cool compresses and OTC topical agents -Discussed likely duration of symptoms  Dizziness -New problem -Discussed various possible causes including dehydration, vertigo, hyperglycemia -Discussed antiemetic medication may help with symptoms and allow for hydration.  Discussed great concern given current inability to keep fluids, food, or medications down.  Liable blood sugars 2/2 DM 1.  Discussed potential for DKA vs HHS due to dehydration.  Patient advised if unable to keep anything down this evening after trying antiemetic will need to proceed to nearest ED for labs, rehydration, and further evaluation.  Patient expresses understanding.    I discussed the assessment and treatment plan with the patient. The patient was provided an opportunity to ask questions and all were answered. The patient agreed with the plan and demonstrated an understanding of the instructions.   The patient was advised to call back or seek an in-person evaluation if the symptoms worsen or if the condition fails to improve as anticipated.  Billie Ruddy, MD

## 2021-05-10 NOTE — Patient Instructions (Signed)
For inability to keep any food or beverage down this evening after trying medication for nausea proceed to nearest emergency department.

## 2021-05-14 MED ORDER — LISINOPRIL 10 MG PO TABS: 10.0000 mg | ORAL_TABLET | Freq: Every day | ORAL | 0 refills | Status: DC

## 2021-06-07 ENCOUNTER — Other Ambulatory Visit: Payer: Self-pay | Admitting: Family Medicine

## 2021-06-07 DIAGNOSIS — I10 Essential (primary) hypertension: Secondary | ICD-10-CM

## 2021-07-08 ENCOUNTER — Other Ambulatory Visit: Payer: Self-pay | Admitting: Family Medicine

## 2021-07-08 DIAGNOSIS — I10 Essential (primary) hypertension: Secondary | ICD-10-CM

## 2021-07-20 ENCOUNTER — Other Ambulatory Visit: Payer: Self-pay | Admitting: Internal Medicine

## 2021-07-27 ENCOUNTER — Ambulatory Visit: Payer: No Typology Code available for payment source | Admitting: Internal Medicine

## 2021-07-30 ENCOUNTER — Encounter: Payer: Self-pay | Admitting: Internal Medicine

## 2021-07-30 ENCOUNTER — Ambulatory Visit: Payer: No Typology Code available for payment source | Admitting: Internal Medicine

## 2021-07-30 VITALS — BP 120/80 | HR 60 | Ht 68.0 in | Wt 130.8 lb

## 2021-07-30 DIAGNOSIS — E1022 Type 1 diabetes mellitus with diabetic chronic kidney disease: Secondary | ICD-10-CM | POA: Diagnosis not present

## 2021-07-30 DIAGNOSIS — R739 Hyperglycemia, unspecified: Secondary | ICD-10-CM

## 2021-07-30 DIAGNOSIS — N183 Chronic kidney disease, stage 3 unspecified: Secondary | ICD-10-CM | POA: Diagnosis not present

## 2021-07-30 LAB — POCT GLYCOSYLATED HEMOGLOBIN (HGB A1C): Hemoglobin A1C: 7.6 % — AB (ref 4.0–5.6)

## 2021-07-30 NOTE — Patient Instructions (Addendum)

## 2021-07-30 NOTE — Progress Notes (Signed)
? ?Name: Willie Fletcher  ?Age/ Sex: 51 y.o., male   ?MRN/ DOB: [id number], Jun 17, 1970    ? ?PCP: Billie Ruddy, MD   ?Reason for Endocrinology Evaluation: Type 1 Diabetes Mellitus  ?Initial Endocrine Consultative Visit: 05/20/2018  ? ? ?PATIENT IDENTIFIER: Willie Fletcher is a 51 y.o. male with a past medical history of T1DM, HTN, CAD  and Dyslipidemia . The patient has followed with Endocrinology clinic since 05/20/2018 for consultative assistance with management of his diabetes. ? ?DIABETIC HISTORY:  ?Willie Fletcher was diagnosed with T1DM in 1984. Moved from NH because his [name]  His initial  hemoglobin A1c at our clinic was 6.6%.  ? ?SUBJECTIVE:  ? ?During the last visit (01/24/2021): A1c 7.3 % . Adjusted basal insulin  ? ? ? ?Today (07/30/2021): Willie Fletcher is here for a follow up on diabetes managemnt. [name]  Willie Fletcher was checking  his blood sugars multiple times daily. The patient has had hypoglycemic episodes since the last clinic visit, which typically occur during the day after a bolus. The patient is symptomatic with these episodes.  ? ?Denies nausea ,  vomiting , diarrhea  ? ?Follows with [facility]  ? ? ?HOME DIABETES REGIMEN:  ?Basaglar 10 units ?Novolog I:C ratio 12/27/11 ?CF :Novolog (BG- 100/50)  ? ? ? ?Statin: Yes ?ACE-I/ARB: yes ? ? ? ?CONTINUOUS GLUCOSE MONITORING RECORD INTERPRETATION   ? ?Dates of Recording: 4/4-4/17/2023 ? ?Sensor description: Dexcom ? ?Results statistics: ?  ?CGM use % of time 100  ?Average and SD 187  ?Time in range 46  %  ?% Time Above 180 38  ?% Time above 250 15  ?% Time Below target <1  ? ? ? ?Glycemic patterns summary: BG's fluctuate through the day and night, without night time hypoglycemia  ? ?Hyperglycemic episodes  postprandial  ? ?Hypoglycemic episodes occurred after a bolus during the day  ? ?Overnight periods: no pattern sometimes glucose is stable and at times it trends down and at times it remains elevated  ? ? ? ?DIABETIC COMPLICATIONS: ?Microvascular  complications:  ?CKD III, Hx of retinoapthy at age 71 ?Denies: neuropathy ?Last eye exam: Completed 12/2020 ?  ?Macrovascular complications:  ?CAD ?Denies: PVD, CVA ? ? ?HISTORY:  ?Past Medical History:  ?Past Medical History:  ?Diagnosis Date  ? Coronary artery disease   ? Diabetes mellitus without complication ([place])   ? Myocardial infarct [facility])   ? ?Past Surgical History:  ?Past Surgical History:  ?Procedure Laterality Date  ? CORONARY ARTERY BYPASS GRAFT    ? EYE SURGERY    ? TOOTH EXTRACTION    ? ?Social History:  reports that Willie Fletcher has never smoked. Willie Fletcher has never used smokeless tobacco. Willie Fletcher reports that Willie Fletcher does not drink alcohol and does not use drugs. ?Family History:  ?Family History  ?Problem Relation Age of Onset  ? CAD Father   ? Hypertension Father   ? Hypertension Other   ? Cancer Neg Hx   ? Diabetes Neg Hx   ? Stroke Neg Hx   ? ? ? ?HOME MEDICATIONS: ?Allergies as of 07/30/2021   ?No Known Allergies ?  ? ?  ?Medication List  ?  ? ?  ? Accurate as of July 30, 2021  3:02 PM. If you have any questions, ask your nurse or doctor.  ?  ?  ? ?  ? ?STOP taking these medications   ? ?ondansetron 4 MG tablet ?Commonly known as: Zofran ?Stopped by: Dorita Sciara, MD ?  ?  traMADol 50 MG tablet ?Commonly known as: ULTRAM ?Stopped by: Dorita Sciara, MD ?  ?valACYclovir 1000 MG tablet ?Commonly known as: VALTREX ?Stopped by: Dorita Sciara, MD ?  ? ?  ? ?TAKE these medications   ? ?acetaminophen 325 MG tablet ?Commonly known as: TYLENOL ?Take 2 tablets (650 mg total) by mouth every 4 (four) hours as needed for mild pain, moderate pain, fever or headache. ?  ?amLODipine 10 MG tablet ?Commonly known as: NORVASC ?Take 1 tablet (10 mg total) by mouth daily. ?What changed:  ?how much to take ?additional instructions ?  ?aspirin EC 81 MG tablet ?Take 1 tablet (81 mg total) by mouth daily. With Food ?  ?atorvastatin 80 MG tablet ?Commonly known as: LIPITOR ?Take 1 tablet (80 mg total) by mouth every evening. ?   ?Basaglar KwikPen 100 UNIT/ML ?INJECT 10 UNITS INTO THE SKIN DAILY. ?  ?calcitRIOL 0.25 MCG capsule ?Commonly known as: ROCALTROL ?Take 0.25 mcg by mouth once a week. ?  ?Dexcom G6 Sensor Misc ?1 each by Does not apply route as directed. ?  ?Dexcom G6 Transmitter Misc ?1 each by Does not apply route See admin instructions. Change transmitter every 90 days. ?  ?furosemide 20 MG tablet ?Commonly known as: LASIX ?TAKE 1 TABLET BY MOUTH EVERY DAY ?  ?glucagon 1 MG injection ?Inject 1 mg into the vein once as needed for up to 1 dose. ?  ?Insulin Pen Needle 32G X 4 MM Misc ?1 Device by Does not apply route in the morning, at noon, in the evening, and at bedtime. ?  ?lisinopril 10 MG tablet ?Commonly known as: ZESTRIL ?TAKE 1 TABLET BY MOUTH EVERY DAY ?  ?metoprolol tartrate 25 MG tablet ?Commonly known as: LOPRESSOR ?Take 1 tablet (25 mg total) by mouth every 8 (eight) hours. ?  ?multivitamin with minerals Tabs tablet ?Take 1 tablet by mouth daily. ?  ?NovoLOG FlexPen 100 UNIT/ML FlexPen ?Generic drug: insulin aspart ?Max daily 50 units ?  ?sodium bicarbonate 650 MG tablet ?Take 1,300 mg by mouth 2 (two) times daily. ?  ? ?  ? ? ? ?OBJECTIVE:  ? ?Vital Signs: BP 120/80 (BP Location: Left Arm, Patient Position: Sitting, Cuff Size: Small)   Pulse 60   Ht '5\' 8"'$  (1.727 m)   Wt 130 lb 12.8 oz (59.3 kg)   SpO2 99%   BMI 19.89 kg/m?   ?Wt Readings from Last 3 Encounters:  ?07/30/21 130 lb 12.8 oz (59.3 kg)  ?01/24/21 134 lb (60.8 kg)  ?09/13/20 128 lb (58.1 kg)  ? ? ? ?Exam: ?General: Pt appears well and is in NAD  ?Lungs: Clear with good BS bilat   ?Heart: RRR   ?Abdomen: Normoactive bowel sounds, soft, nontender, without masses or organomegaly palpable  ?Extremities: No pretibial edema.   ?Neuro: MS is good with appropriate affect, pt is alert and Ox3  ? ? ?DM foot exam: 07/30/2021 ? ?The skin of the feet is intact without sores or ulcerations. ?The pedal pulses are 1+ on right and 1+ on left. ?The sensation is intact to  a screening 5.07, 10 gram monofilament bilaterally ? ?  ?   ? ? ? ?DATA REVIEWED: ? ?Lab Results  ?Component Value Date  ? HGBA1C 7.3 (A) 01/24/2021  ? HGBA1C 7.0 (A) 05/11/2019  ? HGBA1C 6.6 (H) 03/24/2018  ? ?Lab Results  ?Component Value Date  ? MICROALBUR 51.4 (H) 05/11/2019  ? LDLCALC 45 05/11/2019  ? CREATININE 2.57 (H) 04/18/2018  ? ? ? ?  ASSESSMENT / PLAN / RECOMMENDATIONS:  ? ?1) Type 1 Diabetes Mellitus, Sub- Optimally controlled, With CKD III, retinopathy and macrovascular  complications - Most recent A1c of 7.6 %. Goal A1c < 7.5%.   ? ? ?- A1c stable  ?- No hypoglycemia over night, but Willie Fletcher has been noted with hypoglycemia during the day this is due to insulin-carbohydrate mismatch ?-Patient advised to download carbs to go app ?-No changes at this time ? ? ? ?MEDICATIONS: ?- Continue  Basaglar 10 units daily  ?- Novolog I:C ratio 12/27/11 ?-CF :Novolog (BG- 100/50)  ? ?EDUCATION / INSTRUCTIONS: ?BG monitoring instructions: Patient is instructed to check his blood sugars 4 times a day, before meals and bedtime . ?Call [facility] if: BG persistently < 70 ?I reviewed the Rule of 15 for the treatment of hypoglycemia in detail with the patient. Literature supplied. ? ? ? ?2) Diabetic complications:  ?Eye: Does have known diabetic retinopathy.  ?Neuro/ Feet: Does not have known diabetic peripheral neuropathy .  ?Renal: Patient does have known baseline CKD. Willie Fletcher   is  on an ACEI/ARB at present. Urine albumin/creatinine ratio today is elevated  ?  ?3) Dyslipidemia : ? ? ?- Pt on statin therapy, Willie Fletcher is due for a lipid recheck but would like to have that through his PCP  ?- based on refill history Willie Fletcher may be out for a few months, will refill  ? ? ?Medication  ?Atorvastatin 80 mg daily  ? ? ? ?F/U in 6 months  ? ? ?Signed electronically by: ?Abby Nena Jordan, MD ? ?[facility]  ?[facility] ?[address] ?[place], [place] [address] ?Phone: [phone] ?FAX:  [phone] ? ? ?CC: ?Billie Ruddy, MD ?[place] ?[place] [address] ?Phone: [phone]  ?Fax: [phone] ? ?Return to Endocrinology clinic as below: ?No future appointments. ? ?  ? ? ?

## 2021-07-31 ENCOUNTER — Encounter: Payer: Self-pay | Admitting: Internal Medicine

## 2021-08-10 ENCOUNTER — Other Ambulatory Visit: Payer: Self-pay | Admitting: Internal Medicine

## 2021-10-20 ENCOUNTER — Other Ambulatory Visit: Payer: Self-pay | Admitting: Cardiovascular Disease

## 2021-11-27 ENCOUNTER — Encounter: Payer: Self-pay | Admitting: Cardiovascular Disease

## 2021-11-27 ENCOUNTER — Ambulatory Visit: Payer: No Typology Code available for payment source | Admitting: Cardiovascular Disease

## 2021-11-27 DIAGNOSIS — E782 Mixed hyperlipidemia: Secondary | ICD-10-CM

## 2021-11-27 DIAGNOSIS — I10 Essential (primary) hypertension: Secondary | ICD-10-CM

## 2021-11-27 DIAGNOSIS — Z951 Presence of aortocoronary bypass graft: Secondary | ICD-10-CM

## 2021-11-27 NOTE — Assessment & Plan Note (Signed)
History of essential hypertension a blood pressure measured today at 122/70.  He is on amlodipine, lisinopril and metoprolol.

## 2021-11-27 NOTE — Assessment & Plan Note (Signed)
History of CAD status post coronary artery bypass grafting x3 at [facility] and he had overnight [place] in 2017.  He had a LIMA to his LAD, vein to diagonal branch and obtuse marginal branch.  He is completely asymptomatic.

## 2021-11-27 NOTE — Progress Notes (Signed)
11/27/2021 Willie Fletcher   1971/03/30  [id number]  Primary Physician Billie Ruddy, MD Primary Cardiologist: Lorretta Harp MD Lupe Carney, [place]  HPI:  Willie Fletcher is a 51 y.o.  thin appearing married Caucasian male with no children who works in Mid[employer] at [employer]  He was referred by his PCP, Dr. Grier Mitts, to be established because of known disease.  I last saw him in the office 09/13/2020.  He does have a history of treated hypertension, hyperlipidemia and diabetes.  His father had CABG.  He had a small heart attack back in May 2017 in [place] and had CABG x3 at [facility] with a LIMA to his LAD, vein to diagonal branch and obtuse marginal branch.  His other problems include stage IV CKD.  He is very active and walks approximate 10,000 steps a day and has no symptoms.  He was hospitalized in December 2019 with chest pain.  He ruled out for myocardial infarction.  2D echo was normal.   Since I saw him a year ago he is remained stable.  He denies chest pain or shortness of breath.  He does have moderately severe renal insufficiency followed by [facility].   Current Meds  Medication Sig   acetaminophen (TYLENOL) 325 MG tablet Take 2 tablets (650 mg total) by mouth every 4 (four) hours as needed for mild pain, moderate pain, fever or headache.   amLODipine (NORVASC) 10 MG tablet Take 1 tablet (10 mg total) by mouth daily. (Patient taking differently: Take 5 mg by mouth daily. 10 mg causes lower extremity edema)   atorvastatin (LIPITOR) 80 MG tablet Take 1 tablet (80 mg total) by mouth every evening.   calcitRIOL (ROCALTROL) 0.25 MCG capsule Take 0.25 mcg by mouth once a week.   Continuous Blood Gluc Sensor (DEXCOM G6 SENSOR) MISC 1 each by Does not apply route as directed.   Continuous Blood Gluc Transmit (DEXCOM G6 TRANSMITTER) MISC 1 each by Does not apply route See admin instructions. Change transmitter every 90 days.    furosemide (LASIX) 20 MG tablet TAKE 1 TABLET BY MOUTH EVERY DAY   glucagon 1 MG injection Inject 1 mg into the vein once as needed for up to 1 dose.   insulin aspart (NOVOLOG FLEXPEN) 100 UNIT/ML FlexPen INJECT UNDER THE SKIN AS DIRECTED, MAX DAILY 50 UNITS   Insulin Glargine (BASAGLAR KWIKPEN) 100 UNIT/ML INJECT 10 UNITS INTO THE SKIN DAILY.   Insulin Pen Needle 32G X 4 MM MISC 1 Device by Does not apply route in the morning, at noon, in the evening, and at bedtime.   lisinopril (ZESTRIL) 10 MG tablet TAKE 1 TABLET BY MOUTH EVERY DAY   metoprolol tartrate (LOPRESSOR) 25 MG tablet TAKE 1 TABLET BY MOUTH EVERY 8 HOURS.   Multiple Vitamin (MULTIVITAMIN WITH MINERALS) TABS tablet Take 1 tablet by mouth daily.   sodium bicarbonate 650 MG tablet Take 1,300 mg by mouth 2 (two) times daily.     No Known Allergies  Social History   Socioeconomic History   Marital status: Married    Spouse name: Not on file   Number of children: Not on file   Years of education: Not on file   Highest education level: Not on file  Occupational History   Not on file  Tobacco Use   Smoking status: Never   Smokeless tobacco: Never  Substance and Sexual Activity   Alcohol use: Never  Drug use: Never   Sexual activity: Not on file  Other Topics Concern   Not on file  Social History Narrative   Not on file   Social Determinants of Health   Financial Resource Strain: Not on file  Food Insecurity: Not on file  Transportation Needs: Not on file  Physical Activity: Not on file  Stress: Not on file  Social Connections: Not on file  Intimate Partner Violence: Not on file     Review of Systems: General: negative for chills, fever, night sweats or weight changes.  Cardiovascular: negative for chest pain, dyspnea on exertion, edema, orthopnea, palpitations, paroxysmal nocturnal dyspnea or shortness of breath Dermatological: negative for rash Respiratory: negative for cough or wheezing Urologic: negative  for hematuria Abdominal: negative for nausea, vomiting, diarrhea, bright red blood per rectum, melena, or hematemesis Neurologic: negative for visual changes, syncope, or dizziness All other systems reviewed and are otherwise negative except as noted above.    Blood pressure 122/70, pulse (!) 58, resp. rate 20, height '5\' 9"'$  (1.753 m), weight 125 lb 6.4 oz (56.9 kg), SpO2 100 %.  General appearance: alert and no distress Neck: no adenopathy, no carotid bruit, no JVD, supple, symmetrical, trachea midline, and thyroid not enlarged, symmetric, no tenderness/mass/nodules Lungs: clear to auscultation bilaterally Heart: regular rate and rhythm, S1, S2 normal, no murmur, click, rub or gallop Extremities: extremities normal, atraumatic, no cyanosis or edema Pulses: 2+ and symmetric Skin: Skin color, texture, turgor normal. No rashes or lesions Neurologic: Grossly normal  EKG sinus bradycardia 58 with early repolarization pattern.  I personally reviewed this EKG.  ASSESSMENT AND PLAN:   Essential hypertension History of essential hypertension a blood pressure measured today at 122/70.  He is on amlodipine, lisinopril and metoprolol.  Hx of CABG History of CAD status post coronary artery bypass grafting x3 at [facility] and he had overnight [place] in 2017.  He had a LIMA to his LAD, vein to diagonal branch and obtuse marginal branch.  He is completely asymptomatic.  HLD (hyperlipidemia) History of hyperlipidemia on statin therapy.  We will recheck a fasting lipid liver profile.     Lorretta Harp MD FACP,FACC,FAHA, [facility] 11/27/2021 9:18 AM

## 2021-11-27 NOTE — Patient Instructions (Signed)
Medication Instructions:  Your physician recommends that you continue on your current medications as directed. Please refer to the Current Medication list given to you today.  *If you need a refill on your cardiac medications before your next appointment, please call your pharmacy*   Lab Work: Your physician recommends that you return for lab work in: the next week or 2 for FASTING lipid/liver panel  If you have labs (blood work) drawn today and your tests are completely normal, you will receive your results only by: [place] (if you have MyChart) OR A paper copy in the mail If you have any lab test that is abnormal or we need to change your treatment, we will call you to review the results.   Follow-Up: At [facility], you and your health needs are our priority.  As part of our continuing mission to provide you with exceptional heart care, we have created designated Provider Care Teams.  These Care Teams include your primary Cardiologist (physician) and Advanced Practice Providers (APPs -  Physician Assistants and Nurse Practitioners) who all work together to provide you with the care you need, when you need it.  We recommend signing up for the patient portal called "MyChart".  Sign up information is provided on this After Visit Summary.  MyChart is used to connect with patients for Virtual Visits (Telemedicine).  Patients are able to view lab/test results, encounter notes, upcoming appointments, etc.  Non-urgent messages can be sent to your provider as well.   To learn more about what you can do with MyChart, go to [email or web address].    Your next appointment:   12 month(s)  The format for your next appointment:   In Person  Provider:   Quay Burow, MD

## 2021-11-27 NOTE — Assessment & Plan Note (Signed)
History of hyperlipidemia on statin therapy.  We will recheck a fasting lipid liver profile. 

## 2021-11-30 LAB — HEPATIC FUNCTION PANEL
ALT: 28 IU/L (ref 0–44)
AST: 28 IU/L (ref 0–40)
Albumin: 4.7 g/dL (ref 3.8–4.9)
Alkaline Phosphatase: 69 IU/L (ref 44–121)
Bilirubin Total: 1 mg/dL (ref 0.0–1.2)
Bilirubin, Direct: 0.27 mg/dL (ref 0.00–0.40)
Total Protein: 7 g/dL (ref 6.0–8.5)

## 2021-11-30 LAB — LIPID PANEL
Chol/HDL Ratio: 2.4 ratio (ref 0.0–5.0)
Cholesterol, Total: 107 mg/dL (ref 100–199)
HDL: 44 mg/dL (ref >39)
LDL Chol Calc (NIH): 50 mg/dL (ref 0–99)
Triglycerides: 60 mg/dL (ref 0–149)
VLDL Cholesterol Cal: 13 mg/dL (ref 5–40)

## 2021-12-26 ENCOUNTER — Ambulatory Visit: Payer: No Typology Code available for payment source | Admitting: Family Medicine

## 2021-12-26 ENCOUNTER — Encounter: Payer: Self-pay | Admitting: Family Medicine

## 2021-12-26 VITALS — BP 152/78 | HR 68 | Temp 98.2°F | Wt 123.8 lb

## 2021-12-26 DIAGNOSIS — I10 Essential (primary) hypertension: Secondary | ICD-10-CM | POA: Diagnosis not present

## 2021-12-26 DIAGNOSIS — N1832 Chronic kidney disease, stage 3b: Secondary | ICD-10-CM

## 2021-12-26 DIAGNOSIS — E1022 Type 1 diabetes mellitus with diabetic chronic kidney disease: Secondary | ICD-10-CM | POA: Diagnosis not present

## 2021-12-26 DIAGNOSIS — I2583 Coronary atherosclerosis due to lipid rich plaque: Secondary | ICD-10-CM

## 2021-12-26 DIAGNOSIS — I2510 Atherosclerotic heart disease of native coronary artery without angina pectoris: Secondary | ICD-10-CM | POA: Diagnosis not present

## 2021-12-26 DIAGNOSIS — R634 Abnormal weight loss: Secondary | ICD-10-CM

## 2021-12-26 DIAGNOSIS — U071 COVID-19: Secondary | ICD-10-CM

## 2021-12-26 NOTE — Progress Notes (Signed)
Subjective:    Patient ID: Willie Fletcher, male    DOB: 09-05-1970, 50 y.o.   MRN: [id number]  Chief Complaint  Patient presents with   note    Tested positive on 12/17/21, started feeling sick 9/03. States it started clearing up on 9/11. Fever 100.?, cough runny nose, sore throat, overall body ache, and exhaustion, no taste were the symptoms.     HPI Patient was seen today f/u on recent illness. Pt developed symptoms Sunday 9/3 including cough, rhinorrhea, sore throat, body aches, fatigue, no taste.  Patient felt fever 100.18F and then tested positive for COVID on 12/17/2021.  Pt's wife later developed symptoms.  Patient notes improvement in symptoms.  Taste is returning.  Patient needs a note to return to work.  Patient notes elevation in blood pressure this visit.  BP typically well controlled at home 110/80s.  During illness pt was taking OTC cough/cold medications such as NyQuil then started taking medication for people with high blood pressure.  Patient states blood sugar controlled.  Following with nephrology every 3 months.  Had recent cardiology appointment which went well. Past Medical History:  Diagnosis Date   Coronary artery disease    Diabetes mellitus without complication ([place])    Myocardial infarct ([place])     No Known Allergies  ROS General: Denies fever, chills, night sweats, changes in weight, changes in appetite HEENT: Denies headaches, ear pain, changes in vision, rhinorrhea, sore throat CV: Denies CP, palpitations, SOB, orthopnea Pulm: Denies SOB, cough, wheezing GI: Denies abdominal pain, nausea, vomiting, diarrhea, constipation GU: Denies dysuria, hematuria, frequency Msk: Denies muscle cramps, joint pains Neuro: Denies weakness, numbness, tingling Skin: Denies rashes, bruising Psych: Denies depression, anxiety, hallucinations    Objective:    Blood pressure (!) 158/80, pulse 68, temperature 98.2 F (36.8 C), temperature source Temporal, weight 123 lb 12.8 oz  (56.2 kg), SpO2 99 %.  Gen. Pleasant, thin, in no distress, normal affect   HEENT: [place]/AT, face symmetric, conjunctiva clear, no scleral icterus, PERRLA, EOMI, nares patent without drainage. Lungs: no accessory muscle use, CTAB, no wheezes or rales Cardiovascular: RRR, no m/r/g, no peripheral edema Musculoskeletal: No deformities, no cyanosis or clubbing, normal tone Neuro:  A&Ox3, CN II-XII intact, normal gait Skin:  Warm, no lesions/ rash  Wt Readings from Last 3 Encounters:  12/26/21 123 lb 12.8 oz (56.2 kg)  11/27/21 125 lb 6.4 oz (56.9 kg)  07/30/21 130 lb 12.8 oz (59.3 kg)    Lab Results  Component Value Date   WBC 6.3 04/18/2018   HGB 10.2 (L) 04/18/2018   HCT 30.1 (L) 04/18/2018   PLT 195 04/18/2018   GLUCOSE 124 (H) 04/18/2018   CHOL 107 11/30/2021   TRIG 60 11/30/2021   HDL 44 11/30/2021   LDLCALC 50 11/30/2021   ALT 28 11/30/2021   AST 28 11/30/2021   NA 137 04/18/2018   K 4.3 04/18/2018   CL 113 (H) 04/18/2018   CREATININE 2.57 (H) 04/18/2018   BUN 40 (H) 04/18/2018   CO2 14 (L) 04/18/2018   HGBA1C 7.6 (A) 07/30/2021   MICROALBUR 51.4 (H) 05/11/2019    Assessment/Plan:  COVID-19 virus infection -Resolving -Symptoms starting on 12/16/2021 with positive test on 12/17/2021 -Taste returning -Given note for patient to return to work on 12/31/2021 -Given precautions  Essential hypertension -Elevated likely 2/2 recent cough/cold medication use -Controlled at home -Continue to monitor -Continue Norvasc 10 mg daily, Lasix 20 mg daily, lisinopril 10 mg daily, Lopressor 25 mg twice  daily -For continued elevation consistently greater than 140/90 adjust medication -Continue lifestyle modifications -Continue follow-up with cardiology  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 Kelton Pillar  Coronary artery disease due to lipid rich  plaque -Continue Lipitor 80 mg daily  Weight loss -7 pound weight Since 07/30/2021 -Increase intake of protein rich foods -Likely improved as appetite picks up after recent viral illness with COVID-19. -Continue to monitor  F/u as needed in the next 4-6 months, sooner if needed  Grier Mitts, MD

## 2022-01-13 ENCOUNTER — Other Ambulatory Visit: Payer: Self-pay | Admitting: Family Medicine

## 2022-01-13 DIAGNOSIS — I10 Essential (primary) hypertension: Secondary | ICD-10-CM

## 2022-01-17 ENCOUNTER — Other Ambulatory Visit: Payer: Self-pay | Admitting: Cardiovascular Disease

## 2022-01-29 NOTE — Progress Notes (Unsigned)
Name: Willie Fletcher  Age/ Sex: 51 y.o., male   MRN/ DOB: [id number], 02/01/1971     PCP: Billie Ruddy, MD   Reason for Endocrinology Evaluation: Type 1 Diabetes Mellitus  Initial Endocrine Consultative Visit: 05/20/2018    PATIENT IDENTIFIER: Willie Fletcher is a 51 y.o. male with a past medical history of T1DM, HTN, CAD  and Dyslipidemia . The patient has followed with Endocrinology clinic since 05/20/2018 for consultative assistance with management of his diabetes.  DIABETIC HISTORY:  Mr. Willie Fletcher was diagnosed with T1DM in 1984. Moved from [place] because his Willie Fletcher'  His initial  hemoglobin A1c at our clinic was 6.6%.   SUBJECTIVE:   During the last visit (07/30/2021): A1c 7.6 %     Today (01/30/2022): Mr. Willie Fletcher is here for a follow up on diabetes managemnt. Willie Fletcher  He was checking  his blood sugars multiple times daily. The patient has had hypoglycemic episodes since the last clinic visit, which typically occur during the day after a bolus. The patient is symptomatic with these episodes.   Denies nausea ,  vomiting , diarrhea  Had COVID in 12/2021  Follows with [facility] DIABETES REGIMEN:  Basaglar 10 units Novolog I:C ratio 12/27/11 CF :Novolog (BG- 100/50)     Statin: Yes ACE-I/ARB: yes    CONTINUOUS GLUCOSE MONITORING RECORD INTERPRETATION    Dates of Recording: 4/4-4/17/2023  Sensor description: Dexcom  Results statistics:   CGM use % of time 100  Average and SD 187  Time in range 46  %  % Time Above 180 38  % Time above 250 15  % Time Below target <1     Glycemic patterns summary: BG's fluctuate through the day and night, without night time hypoglycemia   Hyperglycemic episodes  postprandial   Hypoglycemic episodes occurred after a bolus during the day   Overnight periods: no pattern sometimes glucose is stable and at times it trends down and at times it remains elevated     DIABETIC COMPLICATIONS: Microvascular complications:   CKD III, Hx of retinoapthy at age 51 Denies: neuropathy Last eye exam: Completed 12/2021   Macrovascular complications:  CAD Denies: PVD, CVA   HISTORY:  Past Medical History:  Past Medical History:  Diagnosis Date   Coronary artery disease    Diabetes mellitus without complication ([place])    Myocardial infarct ([place])    Past Surgical History:  Past Surgical History:  Procedure Laterality Date   CORONARY ARTERY BYPASS GRAFT     EYE SURGERY     TOOTH EXTRACTION     Social History:  reports that he has never smoked. He has never used smokeless tobacco. He reports that he does not drink alcohol and does not use drugs. Family History:  Family History  Problem Relation Age of Onset   CAD Father    Hypertension Father    Hypertension Other    Cancer Neg Hx    Diabetes Neg Hx    Stroke Neg Hx      HOME MEDICATIONS: Allergies as of 01/30/2022   No Known Allergies      Medication List        Accurate as of January 30, 2022  3:48 PM. If you have any questions, ask your nurse or doctor.          acetaminophen 325 MG tablet Commonly known as: TYLENOL Take 2 tablets (650 mg total) by mouth every 4 (four) hours as needed  for mild pain, moderate pain, fever or headache.   amLODipine 10 MG tablet Commonly known as: NORVASC Take 1 tablet (10 mg total) by mouth daily. What changed:  how much to take additional instructions   atorvastatin 80 MG tablet Commonly known as: LIPITOR Take 1 tablet (80 mg total) by mouth every evening.   Basaglar KwikPen 100 UNIT/ML INJECT 10 UNITS INTO THE SKIN DAILY.   calcitRIOL 0.25 MCG capsule Commonly known as: ROCALTROL Take 0.25 mcg by mouth once a week.   Dexcom G6 Sensor Misc 1 each by Does not apply route as directed.   Dexcom G6 Transmitter Misc 1 each by Does not apply route See admin instructions. Change transmitter every 90 days.   furosemide 20 MG tablet Commonly known as: LASIX TAKE 1 TABLET BY MOUTH EVERY DAY    glucagon 1 MG injection Inject 1 mg into the vein once as needed for up to 1 dose.   Insulin Pen Needle 32G X 4 MM Misc 1 Device by Does not apply route in the morning, at noon, in the evening, and at bedtime.   lisinopril 10 MG tablet Commonly known as: ZESTRIL TAKE 1 TABLET BY MOUTH EVERY DAY   metoprolol tartrate 25 MG tablet Commonly known as: LOPRESSOR TAKE 1 TABLET BY MOUTH EVERY 8 HOURS   multivitamin with minerals Tabs tablet Take 1 tablet by mouth daily.   NovoLOG FlexPen 100 UNIT/ML FlexPen Generic drug: insulin aspart INJECT UNDER THE SKIN AS DIRECTED, MAX DAILY 50 UNITS   sodium bicarbonate 650 MG tablet Take 1,300 mg by mouth 2 (two) times daily.         OBJECTIVE:   Vital Signs: BP 110/68 (BP Location: Left Arm, Patient Position: Sitting, Cuff Size: Small)   Pulse 60   Ht '5\' 9"'$  (1.753 m)   Wt 128 lb 3.2 oz (58.2 kg)   SpO2 99%   BMI 18.93 kg/m   Wt Readings from Last 3 Encounters:  01/30/22 128 lb 3.2 oz (58.2 kg)  12/26/21 123 lb 12.8 oz (56.2 kg)  11/27/21 125 lb 6.4 oz (56.9 kg)     Exam: General: Pt appears well and is in NAD  Lungs: Clear with good BS bilat   Heart: RRR   Abdomen: Normoactive bowel sounds, soft, nontender, without masses or organomegaly palpable  Extremities: No pretibial edema.   Neuro: MS is good with appropriate affect, pt is alert and Ox3    DM foot exam: 07/30/2021  The skin of the feet is intact without sores or ulcerations. The pedal pulses are 1+ on right and 1+ on left. The sensation is intact to a screening 5.07, 10 gram monofilament bilaterally          DATA REVIEWED:  Lab Results  Component Value Date   HGBA1C 8.0 (A) 01/30/2022   HGBA1C 7.6 (A) 07/30/2021   HGBA1C 7.3 (A) 01/24/2021   Lab Results  Component Value Date   MICROALBUR 51.4 (H) 05/11/2019   LDLCALC 50 11/30/2021   CREATININE 2.57 (H) 04/18/2018     ASSESSMENT / PLAN / RECOMMENDATIONS:   1) Type 1 Diabetes Mellitus, Sub-  Optimally controlled, With CKD III, retinopathy and macrovascular  complications - Most recent A1c of 7.6 %. Goal A1c < 7.5%.     - A1c stable  - No hypoglycemia over night, but he has been noted with hypoglycemia during the day this is due to insulin-carbohydrate mismatch - Pt not interested in insulin technology  -Patient advised to download  carbs to go app -No changes at this time    MEDICATIONS: - Continue  Basaglar 10 units daily  - Novolog I:C ratio 12/12/9 -CF :Novolog (BG- 100/50)   EDUCATION / INSTRUCTIONS: BG monitoring instructions: Patient is instructed to check his blood sugars 4 times a day, before meals and bedtime . Call [facility] if: BG persistently < 70 I reviewed the Rule of 15 for the treatment of hypoglycemia in detail with the patient. Literature supplied.    2) Diabetic complications:  Eye: Does have known diabetic retinopathy.  Neuro/ Feet: Does not have known diabetic peripheral neuropathy .  Renal: Patient does have known baseline CKD. He   is  on an ACEI/ARB at present. Urine albumin/creatinine ratio today is elevated    3) Dyslipidemia :   - Pt on statin therapy, he is due for a lipid recheck but would like to have that through his PCP  - based on refill history he may be out for a few months, will refill    Medication  Atorvastatin 80 mg daily     F/U in 6 months    Signed electronically by: Mack Guise, MD  [facility] End[facility]in[facility]  [facility] [address] [place], [place] [address] Phone: [phone] FAX: [phone]   CC: Billie Ruddy, [place] [address] Phone: [phone]  Fax: [phone]  Return to Endocrinology clinic as below: Future Appointments  Date Time Provider [place]  06/26/2022  3:00 PM Billie Ruddy, MD [id number]

## 2022-01-30 ENCOUNTER — Ambulatory Visit: Payer: No Typology Code available for payment source | Admitting: Internal Medicine

## 2022-01-30 ENCOUNTER — Encounter: Payer: Self-pay | Admitting: Internal Medicine

## 2022-01-30 VITALS — BP 110/68 | HR 60 | Ht 69.0 in | Wt 128.2 lb

## 2022-01-30 DIAGNOSIS — E10319 Type 1 diabetes mellitus with unspecified diabetic retinopathy without macular edema: Secondary | ICD-10-CM

## 2022-01-30 DIAGNOSIS — N1832 Chronic kidney disease, stage 3b: Secondary | ICD-10-CM | POA: Diagnosis not present

## 2022-01-30 DIAGNOSIS — E1065 Type 1 diabetes mellitus with hyperglycemia: Secondary | ICD-10-CM

## 2022-01-30 DIAGNOSIS — E1022 Type 1 diabetes mellitus with diabetic chronic kidney disease: Secondary | ICD-10-CM

## 2022-01-30 DIAGNOSIS — E1059 Type 1 diabetes mellitus with other circulatory complications: Secondary | ICD-10-CM

## 2022-01-30 LAB — POCT GLYCOSYLATED HEMOGLOBIN (HGB A1C): Hemoglobin A1C: 8 % — AB (ref 4.0–5.6)

## 2022-01-30 NOTE — Patient Instructions (Addendum)
-  Continue Basaglar 10 units daily  -Novolog I:C ratio 12/12/9 -CF :Novolog (BG- 100/50)   HOW TO TREAT LOW BLOOD SUGARS (Blood sugar LESS THAN 70 MG/DL) Please follow the RULE OF 15 for the treatment of hypoglycemia treatment (when your (blood sugars are less than 70 mg/dL)   STEP 1: Take 15 grams of carbohydrates when your blood sugar is low, which includes:  3-4 GLUCOSE TABS  OR 3-4 OZ OF JUICE OR REGULAR SODA OR ONE TUBE OF GLUCOSE GEL    STEP 2: RECHECK blood sugar in 15 MINUTES STEP 3: If your blood sugar is still low at the 15 minute recheck --> then, go back to STEP 1 and treat AGAIN with another 15 grams of carbohydrates.

## 2022-01-31 ENCOUNTER — Encounter: Payer: Self-pay | Admitting: Internal Medicine

## 2022-01-31 DIAGNOSIS — E1065 Type 1 diabetes mellitus with hyperglycemia: Secondary | ICD-10-CM | POA: Insufficient documentation

## 2022-02-24 ENCOUNTER — Encounter: Payer: Self-pay | Admitting: Internal Medicine

## 2022-02-25 ENCOUNTER — Other Ambulatory Visit: Payer: Self-pay | Admitting: Internal Medicine

## 2022-02-25 MED ORDER — LANTUS SOLOSTAR 100 UNIT/ML ~~LOC~~ SOPN: 10.0000 [IU] | PEN_INJECTOR | Freq: Every day | SUBCUTANEOUS | 3 refills | Status: DC

## 2022-02-26 ENCOUNTER — Other Ambulatory Visit (HOSPITAL_COMMUNITY): Payer: Self-pay

## 2022-02-26 NOTE — Telephone Encounter (Signed)
Pt's ins pref doesn't change until January. He got a 150 day supply of Basaglar on 10/2, so it's too soon to fill the Lantus. Rx should process once the pt is eligible for a refill

## 2022-03-19 ENCOUNTER — Encounter: Payer: Self-pay | Admitting: Family Medicine

## 2022-03-19 DIAGNOSIS — Z1211 Encounter for screening for malignant neoplasm of colon: Secondary | ICD-10-CM

## 2022-03-29 ENCOUNTER — Encounter: Payer: Self-pay | Admitting: Gastroenterology

## 2022-04-17 ENCOUNTER — Other Ambulatory Visit: Payer: Self-pay | Admitting: Family Medicine

## 2022-04-17 DIAGNOSIS — Z01818 Encounter for other preprocedural examination: Secondary | ICD-10-CM | POA: Diagnosis not present

## 2022-04-17 DIAGNOSIS — N186 End stage renal disease: Secondary | ICD-10-CM | POA: Diagnosis not present

## 2022-04-17 DIAGNOSIS — N200 Calculus of kidney: Secondary | ICD-10-CM | POA: Diagnosis not present

## 2022-04-17 DIAGNOSIS — N189 Chronic kidney disease, unspecified: Secondary | ICD-10-CM | POA: Diagnosis not present

## 2022-04-17 DIAGNOSIS — I2510 Atherosclerotic heart disease of native coronary artery without angina pectoris: Secondary | ICD-10-CM

## 2022-04-17 DIAGNOSIS — Z01810 Encounter for preprocedural cardiovascular examination: Secondary | ICD-10-CM | POA: Diagnosis not present

## 2022-04-17 DIAGNOSIS — Z7682 Awaiting organ transplant status: Secondary | ICD-10-CM | POA: Diagnosis not present

## 2022-04-17 DIAGNOSIS — I708 Atherosclerosis of other arteries: Secondary | ICD-10-CM | POA: Diagnosis not present

## 2022-04-24 DIAGNOSIS — Z794 Long term (current) use of insulin: Secondary | ICD-10-CM | POA: Diagnosis not present

## 2022-04-24 DIAGNOSIS — Z01818 Encounter for other preprocedural examination: Secondary | ICD-10-CM | POA: Diagnosis not present

## 2022-04-24 DIAGNOSIS — Z79899 Other long term (current) drug therapy: Secondary | ICD-10-CM | POA: Diagnosis not present

## 2022-04-24 DIAGNOSIS — N186 End stage renal disease: Secondary | ICD-10-CM | POA: Diagnosis not present

## 2022-04-24 DIAGNOSIS — E1022 Type 1 diabetes mellitus with diabetic chronic kidney disease: Secondary | ICD-10-CM | POA: Diagnosis not present

## 2022-04-24 DIAGNOSIS — E10319 Type 1 diabetes mellitus with unspecified diabetic retinopathy without macular edema: Secondary | ICD-10-CM | POA: Diagnosis not present

## 2022-04-25 ENCOUNTER — Ambulatory Visit (AMBULATORY_SURGERY_CENTER): Payer: BLUE CROSS/BLUE SHIELD | Admitting: *Deleted

## 2022-04-25 VITALS — Ht 67.0 in | Wt 130.0 lb

## 2022-04-25 DIAGNOSIS — Z1211 Encounter for screening for malignant neoplasm of colon: Secondary | ICD-10-CM

## 2022-04-25 MED ORDER — NA SULFATE-K SULFATE-MG SULF 17.5-3.13-1.6 GM/177ML PO SOLN: 1.0000 | Freq: Once | ORAL | 0 refills | Status: AC

## 2022-04-25 NOTE — Progress Notes (Signed)
No egg or soy allergy known to patient  No issues known to pt with past sedation with any surgeries or procedures Patient denies ever being told they had issues or difficulty with intubation  No FH of Malignant Hyperthermia Pt is not on diet pills Pt is not on  home 02  Pt is not on blood thinners  Pt denies issues with constipation  Pt is not on dialysis Pt denies any upcoming cardiac testing Pt encouraged to use to use Singlecare or Goodrx to reduce cost  Patient's chart reviewed by Osvaldo Angst CNRA prior to previsit and patient appropriate for the [place].  Previsit completed and red dot placed by patient's name on their procedure day (on provider's schedule).  . Visit by phone Instructions sent by mail and by my chart

## 2022-04-29 DIAGNOSIS — N184 Chronic kidney disease, stage 4 (severe): Secondary | ICD-10-CM | POA: Diagnosis not present

## 2022-04-29 DIAGNOSIS — N189 Chronic kidney disease, unspecified: Secondary | ICD-10-CM | POA: Diagnosis not present

## 2022-05-01 DIAGNOSIS — D631 Anemia in chronic kidney disease: Secondary | ICD-10-CM | POA: Diagnosis not present

## 2022-05-01 DIAGNOSIS — N184 Chronic kidney disease, stage 4 (severe): Secondary | ICD-10-CM | POA: Diagnosis not present

## 2022-05-01 DIAGNOSIS — I129 Hypertensive chronic kidney disease with stage 1 through stage 4 chronic kidney disease, or unspecified chronic kidney disease: Secondary | ICD-10-CM | POA: Diagnosis not present

## 2022-05-01 DIAGNOSIS — R809 Proteinuria, unspecified: Secondary | ICD-10-CM | POA: Diagnosis not present

## 2022-05-08 ENCOUNTER — Encounter: Payer: Self-pay | Admitting: Gastroenterology

## 2022-05-09 DIAGNOSIS — L03032 Cellulitis of left toe: Secondary | ICD-10-CM | POA: Diagnosis not present

## 2022-05-09 DIAGNOSIS — M7989 Other specified soft tissue disorders: Secondary | ICD-10-CM | POA: Diagnosis not present

## 2022-05-14 ENCOUNTER — Encounter: Payer: Self-pay | Admitting: Gastroenterology

## 2022-05-14 ENCOUNTER — Ambulatory Visit (AMBULATORY_SURGERY_CENTER): Payer: BLUE CROSS/BLUE SHIELD | Admitting: Gastroenterology

## 2022-05-14 VITALS — BP 141/68 | HR 61 | Temp 95.5°F | Resp 16 | Ht 67.0 in | Wt 130.0 lb

## 2022-05-14 DIAGNOSIS — Z1211 Encounter for screening for malignant neoplasm of colon: Secondary | ICD-10-CM

## 2022-05-14 DIAGNOSIS — D122 Benign neoplasm of ascending colon: Secondary | ICD-10-CM

## 2022-05-14 MED ORDER — SODIUM CHLORIDE 0.9 % IV SOLN: 500.0000 mL | Freq: Once | INTRAVENOUS | Status: DC

## 2022-05-14 NOTE — Progress Notes (Signed)
A and O x3. Report to RN. Tolerated MAC anesthesia well. 

## 2022-05-14 NOTE — Progress Notes (Unsigned)
Called to room to assist during endoscopic procedure.  Patient ID and intended procedure confirmed with present staff. Received instructions for my participation in the procedure from the performing physician.  

## 2022-05-14 NOTE — Patient Instructions (Addendum)
    Handouts on polyps & hemorrhoids  given to you today  Await pathology results on polyp removed     YOU HAD AN ENDOSCOPIC PROCEDURE TODAY AT [facility]:   Refer to the procedure report that was given to you for any specific questions about what was found during the examination.  If the procedure report does not answer your questions, please call your gastroenterologist to clarify.  If you requested that your care partner not be given the details of your procedure findings, then the procedure report has been included in a sealed envelope for you to review at your convenience later.  YOU SHOULD EXPECT: Some feelings of bloating in the abdomen. Passage of more gas than usual.  Walking can help get rid of the air that was put into your GI tract during the procedure and reduce the bloating. If you had a lower endoscopy (such as a colonoscopy or flexible sigmoidoscopy) you may notice spotting of blood in your stool or on the toilet paper. If you underwent a bowel prep for your procedure, you may not have a normal bowel movement for a few days.  Please Note:  You might notice some irritation and congestion in your nose or some drainage.  This is from the oxygen used during your procedure.  There is no need for concern and it should clear up in a day or so.  SYMPTOMS TO REPORT IMMEDIATELY:  Following lower endoscopy (colonoscopy or flexible sigmoidoscopy):  Excessive amounts of blood in the stool  Significant tenderness or worsening of abdominal pains  Swelling of the abdomen that is new, acute  Fever of 100F or higher   For urgent or emergent issues, a gastroenterologist can be reached at any hour by calling [phone]. Do not use MyChart messaging for urgent concerns.    DIET:  We do recommend a small meal at first, but then you may proceed to your regular diet.  Drink plenty of fluids but you should avoid alcoholic beverages for 24 hours.  ACTIVITY:  You should plan  to take it easy for the rest of today and you should NOT DRIVE or use heavy machinery until tomorrow (because of the sedation medicines used during the test).    FOLLOW UP: Our staff will call the number listed on your records the next business day following your procedure.  We will call around 7:15- 8:00 am to check on you and address any questions or concerns that you may have regarding the information given to you following your procedure. If we do not reach you, we will leave a message.     If any biopsies were taken you will be contacted by phone or by letter within the next 1-3 weeks.  Please call us at [phone] if you have not heard about the biopsies in 3 weeks.    SIGNATURES/CONFIDENTIALITY: You and/or your care partner have signed paperwork which will be entered into your electronic medical record.  These signatures attest to the fact that that the information above on your After Visit Summary has been reviewed and is understood.  Full responsibility of the confidentiality of this discharge information lies with you and/or your care-partner.

## 2022-05-14 NOTE — Progress Notes (Unsigned)
[facility] History and Physical   Primary Care Physician:  Billie Ruddy, MD   Reason for Procedure:  Colorectal cancer screening  Plan:    Screening colonoscopy with possible interventions as needed     HPI: Willie Fletcher is a very pleasant 52 y.o. male here for screening colonoscopy. Denies any nausea, vomiting, abdominal pain, melena or bright red blood per rectum  The risks and benefits as well as alternatives of endoscopic procedure(s) have been discussed and reviewed. All questions answered. The patient agrees to proceed.    Past Medical History:  Diagnosis Date   Coronary artery disease    Diabetes mellitus without complication ([place])    Myocardial infarct ([place])    2016    Past Surgical History:  Procedure Laterality Date   CORONARY ARTERY BYPASS GRAFT     EYE SURGERY     TOOTH EXTRACTION      Prior to Admission medications   Medication Sig Start Date End Date Taking? Authorizing Provider  acetaminophen (TYLENOL) 325 MG tablet Take 2 tablets (650 mg total) by mouth every 4 (four) hours as needed for mild pain, moderate pain, fever or headache. 03/24/18  Yes Emokpae, Courage, MD  amLODipine (NORVASC) 10 MG tablet Take 1 tablet (10 mg total) by mouth daily. Patient taking differently: Take 5 mg by mouth daily. 10 mg causes lower extremity edema 05/11/19  Yes Shamleffer, Melanie Crazier, MD  atorvastatin (LIPITOR) 80 MG tablet TAKE 1 TABLET BY MOUTH EVERY EVENING 04/17/22  Yes Billie Ruddy, MD  calcitRIOL (ROCALTROL) 0.25 MCG capsule Take 0.25 mcg by mouth 3 (three) times a week. 07/14/21  Yes [provider]  furosemide (LASIX) 20 MG tablet TAKE 1 TABLET BY MOUTH EVERY DAY 07/21/20  Yes Billie Ruddy, MD  insulin aspart (NOVOLOG FLEXPEN) 100 UNIT/ML FlexPen INJECT UNDER THE SKIN AS DIRECTED, MAX DAILY 50 UNITS 08/13/21  Yes Shamleffer, Melanie Crazier, MD  insulin glargine (LANTUS SOLOSTAR) 100 UNIT/ML Solostar Pen Inject 10 Units into the  skin daily. 02/25/22  Yes Shamleffer, Melanie Crazier, MD  Insulin Pen Needle 32G X 4 MM MISC 1 Device by Does not apply route in the morning, at noon, in the evening, and at bedtime. 07/19/20  Yes Shamleffer, Melanie Crazier, MD  lisinopril (ZESTRIL) 10 MG tablet TAKE 1 TABLET BY MOUTH EVERY DAY 01/14/22  Yes Billie Ruddy, MD  metoprolol tartrate (LOPRESSOR) 25 MG tablet TAKE 1 TABLET BY MOUTH EVERY 8 HOURS 01/17/22  Yes Lorretta Harp, MD  Multiple Vitamin (MULTIVITAMIN WITH MINERALS) TABS tablet Take 1 tablet by mouth daily.   Yes [provider]  Continuous Blood Gluc Sensor (DEXCOM G6 SENSOR) MISC 1 each by Does not apply route as directed. 06/10/18   Elayne Snare, MD  Continuous Blood Gluc Transmit (DEXCOM G6 TRANSMITTER) MISC 1 each by Does not apply route See admin instructions. Change transmitter every 90 days. 06/10/18   Elayne Snare, MD  glucagon 1 MG injection Inject 1 mg into the vein once as needed for up to 1 dose. Patient not taking: Reported on 05/14/2022 05/20/18   Shamleffer, Melanie Crazier, MD  sodium bicarbonate 650 MG tablet Take 1,300 mg by mouth 2 (two) times daily. Patient not taking: Reported on 04/25/2022 02/20/19   [provider]    Current Outpatient Medications  Medication Sig Dispense Refill   acetaminophen (TYLENOL) 325 MG tablet Take 2 tablets (650 mg total) by mouth every 4 (four) hours as needed for mild pain, moderate  pain, fever or headache. 30 tablet 0   amLODipine (NORVASC) 10 MG tablet Take 1 tablet (10 mg total) by mouth daily. (Patient taking differently: Take 5 mg by mouth daily. 10 mg causes lower extremity edema) 90 tablet 3   atorvastatin (LIPITOR) 80 MG tablet TAKE 1 TABLET BY MOUTH EVERY EVENING 90 tablet 3   calcitRIOL (ROCALTROL) 0.25 MCG capsule Take 0.25 mcg by mouth 3 (three) times a week.     furosemide (LASIX) 20 MG tablet TAKE 1 TABLET BY MOUTH EVERY DAY 30 tablet 0   insulin aspart (NOVOLOG FLEXPEN) 100 UNIT/ML FlexPen INJECT  UNDER THE SKIN AS DIRECTED, MAX DAILY 50 UNITS 45 mL 2   insulin glargine (LANTUS SOLOSTAR) 100 UNIT/ML Solostar Pen Inject 10 Units into the skin daily. 15 mL 3   Insulin Pen Needle 32G X 4 MM MISC 1 Device by Does not apply route in the morning, at noon, in the evening, and at bedtime. 400 each 3   lisinopril (ZESTRIL) 10 MG tablet TAKE 1 TABLET BY MOUTH EVERY DAY 90 tablet 1   metoprolol tartrate (LOPRESSOR) 25 MG tablet TAKE 1 TABLET BY MOUTH EVERY 8 HOURS 270 tablet 2   Multiple Vitamin (MULTIVITAMIN WITH MINERALS) TABS tablet Take 1 tablet by mouth daily.     Continuous Blood Gluc Sensor (DEXCOM G6 SENSOR) MISC 1 each by Does not apply route as directed. 6 each 2   Continuous Blood Gluc Transmit (DEXCOM G6 TRANSMITTER) MISC 1 each by Does not apply route See admin instructions. Change transmitter every 90 days. 1 each 4   glucagon 1 MG injection Inject 1 mg into the vein once as needed for up to 1 dose. (Patient not taking: Reported on 05/14/2022) 1 each 12   sodium bicarbonate 650 MG tablet Take 1,300 mg by mouth 2 (two) times daily. (Patient not taking: Reported on 04/25/2022)     Current Facility-Administered Medications  Medication Dose Route Frequency Provider Last Rate Last Admin   0.9 %  sodium chloride infusion  500 mL Intravenous Once Mauri Pole, MD        Allergies as of 05/14/2022   (No Known Allergies)    Family History  Problem Relation Age of Onset   CAD Father    Hypertension Father    Hypertension Other    Cancer Neg Hx    Diabetes Neg Hx    Stroke Neg Hx    Colon polyps Neg Hx    Colon cancer Neg Hx    Esophageal cancer Neg Hx    Rectal cancer Neg Hx    Stomach cancer Neg Hx     Social History   Socioeconomic History   Marital status: Married    Spouse name: Not on file   Number of children: Not on file   Years of education: Not on file   Highest education level: 12th grade  Occupational History   Not on file  Tobacco Use   Smoking status:  Never   Smokeless tobacco: Never  Vaping Use   Vaping Use: Never used  Substance and Sexual Activity   Alcohol use: Never   Drug use: Never   Sexual activity: Not on file  Other Topics Concern   Not on file  Social History Narrative   Not on file   Social Determinants of Health   Financial Resource Strain: Low Risk  (12/24/2021)   Overall Financial Resource Strain (CARDIA)    Difficulty of Paying Living Expenses: Not very hard  Food Insecurity: No Food Insecurity (12/24/2021)   Hunger Vital Sign    Worried About Running Out of Food in the Last Year: Never true    Ran Out of Food in the Last Year: Never true  Transportation Needs: No Transportation Needs (12/24/2021)   PRAPARE - Hydrologist (Medical): No    Lack of Transportation (Non-Medical): No  Physical Activity: Sufficiently Active (12/24/2021)   Exercise Vital Sign    Days of Exercise per Week: 3 days    Minutes of Exercise per Session: 60 min  Stress: Stress Concern Present (12/24/2021)   [place]    Feeling of Stress : To some extent  Social Connections: Moderately Isolated (12/24/2021)   Social Connection and Isolation Panel [NHANES]    Frequency of Communication with Friends and Family: More than three times a week    Frequency of Social Gatherings with Friends and Family: Once a week    Attends Religious Services: Never    Marine scientist or Organizations: No    Attends Music therapist: Not on file    Marital Status: Married  Human resources officer Violence: Not on file    Review of Systems:  All other review of systems negative except as mentioned in the HPI.  Physical Exam: Vital signs in last 24 hours: Blood Pressure (Abnormal) 151/78   Pulse 74   Temperature (Abnormal) 95.5 F (35.3 C)   Height '5\' 7"'$  (1.702 m)   Weight 130 lb (59 kg)   Oxygen Saturation 100%   Body Mass Index 20.36 kg/m   General:   Alert, NAD Lungs:  Clear .   Heart:  Regular rate and rhythm Abdomen:  Soft, nontender and nondistended. Neuro/Psych:  Alert and cooperative. Normal mood and affect. A and O x 3  Reviewed labs, radiology imaging, old records and pertinent past GI work up  Patient is appropriate for planned procedure(s) and anesthesia in an ambulatory setting   K. Denzil Magnuson , MD [phone]

## 2022-05-14 NOTE — Op Note (Signed)
[facility] Patient Name: Willie Fletcher Procedure Date: 05/14/2022 1:34 PM MRN: [id number] Endoscopist: Mauri Pole , MD, [id number] Age: 52 Referring MD:  Date of Birth: [birth year] Gender: Male Account #: [id number] Procedure:                Colonoscopy Indications:              Screening for colorectal malignant neoplasm Medicines:                Monitored Anesthesia Care Procedure:                Pre-Anesthesia Assessment:                           - Prior to the procedure, a History and Physical                            was performed, and patient medications and                            allergies were reviewed. The patient's tolerance of                            previous anesthesia was also reviewed. The risks                            and benefits of the procedure and the sedation                            options and risks were discussed with the patient.                            All questions were answered, and informed consent                            was obtained. Prior Anticoagulants: The patient has                            taken no anticoagulant or antiplatelet agents. ASA                            Grade Assessment: III - A patient with severe                            systemic disease. After reviewing the risks and                            benefits, the patient was deemed in satisfactory                            condition to undergo the procedure.                           After obtaining informed consent, the colonoscope  was passed under direct vision. Throughout the                            procedure, the patient's blood pressure, pulse, and                            oxygen saturations were monitored continuously. The                            [id number]-HQ190L Colonoscope was introduced through the                            anus and advanced to the the cecum, identified by                             appendiceal orifice and ileocecal valve. The                            colonoscopy was performed without difficulty. The                            patient tolerated the procedure well. The quality                            of the bowel preparation was good. The ileocecal                            valve, appendiceal orifice, and rectum were                            photographed. Scope In: 1:36:08 PM Scope Out: 1:59:48 PM Scope Withdrawal Time: 0 hours 9 minutes 8 seconds  Total Procedure Duration: 0 hours 23 minutes 40 seconds  Findings:                 The perianal and digital rectal examinations were                            normal.                           A 5 mm polyp was found in the ascending colon. The                            polyp was sessile. The polyp was removed with a                            cold snare. Resection and retrieval were complete.                           Non-bleeding external and internal hemorrhoids were                            found during retroflexion. The hemorrhoids were  medium-sized. Complications:            No immediate complications. Estimated Blood Loss:     Estimated blood loss was minimal. Impression:               - One 5 mm polyp in the ascending colon, removed                            with a cold snare. Resected and retrieved.                           - Non-bleeding external and internal hemorrhoids. Recommendation:           - Patient has a contact number available for                            emergencies. The signs and symptoms of potential                            delayed complications were discussed with the                            patient. Return to normal activities tomorrow.                            Written discharge instructions were provided to the                            patient.                           - Resume previous diet.                           - Continue present  medications.                           - Await pathology results.                           - Repeat colonoscopy in 5-10 years for surveillance                            based on pathology results. Mauri Pole, MD 05/14/2022 2:22:56 PM This report has been signed electronically.

## 2022-05-14 NOTE — Progress Notes (Unsigned)
Pt's states no medical or surgical changes since previsit or office visit. 

## 2022-05-15 ENCOUNTER — Telehealth: Payer: Self-pay | Admitting: *Deleted

## 2022-05-15 ENCOUNTER — Encounter: Payer: Self-pay | Admitting: Gastroenterology

## 2022-05-15 NOTE — Telephone Encounter (Signed)
  Follow up Call-     05/14/2022   12:56 PM  Call back number  Post procedure Call Back phone  # [phone]  Permission to leave phone message Yes    Post procedure follow up phone call. No answer at number given.  Left message on voicemail.

## 2022-05-25 ENCOUNTER — Other Ambulatory Visit: Payer: Self-pay | Admitting: Family Medicine

## 2022-05-25 ENCOUNTER — Other Ambulatory Visit: Payer: Self-pay | Admitting: Internal Medicine

## 2022-05-25 DIAGNOSIS — I10 Essential (primary) hypertension: Secondary | ICD-10-CM

## 2022-05-30 ENCOUNTER — Encounter: Payer: Self-pay | Admitting: Gastroenterology

## 2022-06-01 DIAGNOSIS — E1065 Type 1 diabetes mellitus with hyperglycemia: Secondary | ICD-10-CM | POA: Diagnosis not present

## 2022-06-05 DIAGNOSIS — E10319 Type 1 diabetes mellitus with unspecified diabetic retinopathy without macular edema: Secondary | ICD-10-CM | POA: Diagnosis not present

## 2022-06-05 DIAGNOSIS — N184 Chronic kidney disease, stage 4 (severe): Secondary | ICD-10-CM | POA: Diagnosis not present

## 2022-06-05 DIAGNOSIS — Z951 Presence of aortocoronary bypass graft: Secondary | ICD-10-CM | POA: Diagnosis not present

## 2022-06-05 DIAGNOSIS — Z01818 Encounter for other preprocedural examination: Secondary | ICD-10-CM | POA: Diagnosis not present

## 2022-06-05 DIAGNOSIS — Z01810 Encounter for preprocedural cardiovascular examination: Secondary | ICD-10-CM | POA: Diagnosis not present

## 2022-06-05 DIAGNOSIS — Z7682 Awaiting organ transplant status: Secondary | ICD-10-CM | POA: Diagnosis not present

## 2022-06-05 DIAGNOSIS — N186 End stage renal disease: Secondary | ICD-10-CM | POA: Diagnosis not present

## 2022-06-05 DIAGNOSIS — E1022 Type 1 diabetes mellitus with diabetic chronic kidney disease: Secondary | ICD-10-CM | POA: Diagnosis not present

## 2022-06-24 DIAGNOSIS — E113593 Type 2 diabetes mellitus with proliferative diabetic retinopathy without macular edema, bilateral: Secondary | ICD-10-CM | POA: Diagnosis not present

## 2022-06-26 ENCOUNTER — Ambulatory Visit: Payer: BLUE CROSS/BLUE SHIELD | Admitting: Family Medicine

## 2022-06-26 ENCOUNTER — Encounter: Payer: Self-pay | Admitting: Family Medicine

## 2022-06-26 VITALS — BP 120/70 | HR 64 | Temp 98.4°F | Wt 131.6 lb

## 2022-06-26 DIAGNOSIS — H9319 Tinnitus, unspecified ear: Secondary | ICD-10-CM | POA: Diagnosis not present

## 2022-06-26 DIAGNOSIS — M25551 Pain in right hip: Secondary | ICD-10-CM

## 2022-06-26 DIAGNOSIS — N184 Chronic kidney disease, stage 4 (severe): Secondary | ICD-10-CM

## 2022-06-26 DIAGNOSIS — E1065 Type 1 diabetes mellitus with hyperglycemia: Secondary | ICD-10-CM | POA: Diagnosis not present

## 2022-06-26 DIAGNOSIS — M5431 Sciatica, right side: Secondary | ICD-10-CM

## 2022-06-26 DIAGNOSIS — Z7682 Awaiting organ transplant status: Secondary | ICD-10-CM

## 2022-06-26 LAB — POCT GLYCOSYLATED HEMOGLOBIN (HGB A1C): Hemoglobin A1C: 7.9 % — AB (ref 4.0–5.6)

## 2022-06-26 NOTE — Progress Notes (Signed)
Established Patient Office Visit   Subjective  Patient ID: Willie Fletcher, male    DOB: 04-28-1970  Age: 52 y.o. MRN: [id number]  Chief Complaint  Patient presents with   Medical Management of Chronic Issues    Patient is a 52 year old male seen for follow-up.  Patient followed by endocrinolog,y, Dr. Virgilio Frees for DM1.  Patient in need of renal transplant due to CKD stage IV.  On transplant list.  Patient notes tinnitus, right hip pain with radiation down posterior right leg.  Denies injury.    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 05/11/2019   CKD (chronic kidney disease), stage IV 04/18/2018   CAD (coronary artery disease) 04/18/2018   HLD (hyperlipidemia) 04/18/2018   Diabetic retinopathy associated with type 1 diabetes mellitus 04/01/2018   Chest pain 03/23/2018   Hyperkalemia 03/23/2018   Type 1 diabetes mellitus with retinopathy 03/23/2018   Essential hypertension 03/23/2018   Hx of CABG 03/23/2018   CKD (chronic kidney disease), stage III 03/23/2018   Past Surgical History:  Procedure Laterality Date   CORONARY ARTERY BYPASS GRAFT     EYE SURGERY     TOOTH EXTRACTION     Social History   Tobacco Use   Smoking status: Never   Smokeless tobacco: Never  Vaping Use   Vaping Use: Never used  Substance Use Topics   Alcohol use: Never   Drug use: Never   Family History  Problem Relation Age of Onset   CAD Father    Hypertension Father    Hypertension Other    Cancer Neg Hx    Diabetes Neg Hx    Stroke Neg Hx    Colon polyps Neg Hx    Colon cancer Neg Hx    Esophageal cancer Neg Hx    Rectal cancer Neg Hx    Stomach cancer Neg Hx    No Known Allergies    ROS Negative unless stated above    Objective:     BP 120/70 (BP Location: Left Arm, Patient Position: Sitting, Cuff Size: Normal)   Pulse 64   Temp 98.4 F  (36.9 C) (Oral)   Wt 131 lb 9.6 oz (59.7 kg)   SpO2 99%   BMI 20.61 kg/m    Physical Exam Constitutional:      General: He is not in acute distress.    Appearance: Normal appearance.  HENT:     Head: Normocephalic and atraumatic.     Nose: Nose normal.     Mouth/Throat:     Mouth: Mucous membranes are moist.  Cardiovascular:     Rate and Rhythm: Normal rate and regular rhythm.     Heart sounds: Normal heart sounds. No murmur heard.    No gallop.  Pulmonary:     Effort: Pulmonary effort is normal. No respiratory distress.     Breath sounds: Normal breath sounds. No wheezing, rhonchi or rales.  Musculoskeletal:     Right hip: Deformity present. Decreased range of motion. Decreased strength.     Left hip: Deformity present. Decreased range of motion. Decreased strength.     Comments: Negative logroll, straight leg raise, FADIR, FABER.  Skin:    General: Skin is warm and dry.  Neurological:     Mental Status: He is alert and oriented to person, place, and time.      Results for  orders placed or performed in visit on 06/26/22  POC HgB A1c  Result Value Ref Range   Hemoglobin A1C 7.9 (A) 4.0 - 5.6 %   HbA1c POC (<> result, manual entry)     HbA1c, POC (prediabetic range)     HbA1c, POC (controlled diabetic range)     Diabetic Foot Exam - Simple   Simple Foot Form Diabetic Foot exam was performed with the following findings: Yes 06/26/2022  3:36 PM  Visual Inspection No deformities, no ulcerations, no other skin breakdown bilaterally: Yes Sensation Testing Intact to touch and monofilament testing bilaterally: Yes Pulse Check Posterior Tibialis and Dorsalis pulse intact bilaterally: Yes Comments       Assessment & Plan:  Type 1 diabetes mellitus with hyperglycemia (HCC) -     Microalbumin / creatinine urine ratio -     POCT glycosylated hemoglobin (Hb A1C)  Right hip pain  Sciatica of right side  Tinnitus, unspecified laterality  CKD (chronic kidney  disease), stage IV (HCC)  Pre-kidney transplant, patient on transplant list  Continue follow-up with endocrinology.  A1c this visit 7.9%.  Foot exam done this visit.  Continue current medications.  Stretching and supportive care for right hip pain likely 2/2 bursitis with sciatica.  Avoid nephrotoxic meds and renally dose medications.  Continue follow-up with nephrology.  Return if symptoms worsen or fail to improve.   Deeann Saint, MD

## 2022-08-12 ENCOUNTER — Ambulatory Visit: Payer: No Typology Code available for payment source | Admitting: Internal Medicine

## 2022-09-12 DIAGNOSIS — R809 Proteinuria, unspecified: Secondary | ICD-10-CM | POA: Diagnosis not present

## 2022-09-12 DIAGNOSIS — I129 Hypertensive chronic kidney disease with stage 1 through stage 4 chronic kidney disease, or unspecified chronic kidney disease: Secondary | ICD-10-CM | POA: Diagnosis not present

## 2022-09-12 DIAGNOSIS — D631 Anemia in chronic kidney disease: Secondary | ICD-10-CM | POA: Diagnosis not present

## 2022-09-12 DIAGNOSIS — N184 Chronic kidney disease, stage 4 (severe): Secondary | ICD-10-CM | POA: Diagnosis not present

## 2022-09-12 LAB — BASIC METABOLIC PANEL
BUN: 60 — AB (ref 4–21)
CO2: 22 (ref 13–22)
Creatinine: 2.7 — AB (ref 0.6–1.3)
Glucose: 86
Potassium: 4.5 mEq/L (ref 3.5–5.1)
Sodium: 137 (ref 137–147)

## 2022-09-12 LAB — CBC AND DIFFERENTIAL: Hemoglobin: 11.3 — AB (ref 13.5–17.5)

## 2022-09-12 LAB — COMPREHENSIVE METABOLIC PANEL
Albumin: 4.4 (ref 3.5–5.0)
Calcium: 9 (ref 8.7–10.7)
eGFR: 28

## 2022-09-24 ENCOUNTER — Encounter: Payer: Self-pay | Admitting: Family Medicine

## 2022-09-24 NOTE — Progress Notes (Unsigned)
From [facility]

## 2022-11-26 DIAGNOSIS — E1065 Type 1 diabetes mellitus with hyperglycemia: Secondary | ICD-10-CM | POA: Diagnosis not present

## 2022-12-24 ENCOUNTER — Other Ambulatory Visit: Payer: Self-pay

## 2022-12-24 DIAGNOSIS — I10 Essential (primary) hypertension: Secondary | ICD-10-CM

## 2022-12-24 MED ORDER — LISINOPRIL 10 MG PO TABS: 10.0000 mg | ORAL_TABLET | Freq: Every day | ORAL | 0 refills | Status: DC

## 2022-12-30 DIAGNOSIS — E113593 Type 2 diabetes mellitus with proliferative diabetic retinopathy without macular edema, bilateral: Secondary | ICD-10-CM | POA: Diagnosis not present

## 2023-01-20 DIAGNOSIS — N184 Chronic kidney disease, stage 4 (severe): Secondary | ICD-10-CM | POA: Diagnosis not present

## 2023-01-20 DIAGNOSIS — N2581 Secondary hyperparathyroidism of renal origin: Secondary | ICD-10-CM | POA: Diagnosis not present

## 2023-01-20 DIAGNOSIS — I129 Hypertensive chronic kidney disease with stage 1 through stage 4 chronic kidney disease, or unspecified chronic kidney disease: Secondary | ICD-10-CM | POA: Diagnosis not present

## 2023-01-20 DIAGNOSIS — R809 Proteinuria, unspecified: Secondary | ICD-10-CM | POA: Diagnosis not present

## 2023-01-20 DIAGNOSIS — D631 Anemia in chronic kidney disease: Secondary | ICD-10-CM | POA: Diagnosis not present

## 2023-01-21 LAB — LAB REPORT - SCANNED: EGFR: 29

## 2023-01-23 ENCOUNTER — Encounter: Payer: Self-pay | Admitting: Internal Medicine

## 2023-02-24 DIAGNOSIS — E1065 Type 1 diabetes mellitus with hyperglycemia: Secondary | ICD-10-CM | POA: Diagnosis not present

## 2023-04-02 ENCOUNTER — Encounter: Payer: Self-pay | Admitting: Internal Medicine

## 2023-04-02 ENCOUNTER — Ambulatory Visit: Payer: BLUE CROSS/BLUE SHIELD | Admitting: Internal Medicine

## 2023-04-02 VITALS — BP 112/70 | HR 98 | Ht 67.0 in | Wt 139.0 lb

## 2023-04-02 DIAGNOSIS — Z23 Encounter for immunization: Secondary | ICD-10-CM | POA: Diagnosis not present

## 2023-04-02 DIAGNOSIS — E1022 Type 1 diabetes mellitus with diabetic chronic kidney disease: Secondary | ICD-10-CM

## 2023-04-02 DIAGNOSIS — N1832 Chronic kidney disease, stage 3b: Secondary | ICD-10-CM | POA: Diagnosis not present

## 2023-04-02 LAB — POCT GLYCOSYLATED HEMOGLOBIN (HGB A1C): Hemoglobin A1C: 7.4 % — AB (ref 4.0–5.6)

## 2023-04-02 MED ORDER — LANTUS SOLOSTAR 100 UNIT/ML ~~LOC~~ SOPN: 12.0000 [IU] | PEN_INJECTOR | Freq: Every day | SUBCUTANEOUS | 2 refills | Status: DC

## 2023-04-02 MED ORDER — DEXCOM G7 SENSOR MISC: 1.0000 | 3 refills | Status: DC

## 2023-04-02 MED ORDER — NOVOLOG FLEXPEN 100 UNIT/ML ~~LOC~~ SOPN: PEN_INJECTOR | SUBCUTANEOUS | 2 refills | Status: DC

## 2023-04-02 MED ORDER — INSULIN PEN NEEDLE 32G X 4 MM MISC: 1.0000 | Freq: Four times a day (QID) | 3 refills | Status: DC

## 2023-04-02 NOTE — Progress Notes (Signed)
Name: Willie Fletcher  Age/ Sex: 52 y.o., male   MRN/ DOB: [id number], Aug 25, 1970     PCP: Deeann Saint, MD   Reason for Endocrinology Evaluation: Type 1 Diabetes Mellitus  Initial Endocrine Consultative Visit: 05/20/2018    PATIENT IDENTIFIER: Willie Fletcher is a 52 y.o. male with a past medical history of T1DM, HTN, CAD  and Dyslipidemia . The patient has followed with Endocrinology clinic since 05/20/2018 for consultative assistance with management of his diabetes.  DIABETIC HISTORY:  Willie Fletcher was diagnosed with T1DM in 1984. Moved from [place] because his Marchia Meiers'  His initial  hemoglobin A1c at our clinic was 6.6%.   SUBJECTIVE:   During the last visit (01/30/2022): A1c 8.0%     Today (04/02/2023): Willie Fletcher is here for a follow up on diabetes managemnt. [name]  He was checking  his blood sugars multiple times daily. The patient has not had hypoglycemic episodes since the last clinic visit.  Denies nausea ,  vomiting , Denies constipation or  diarrhea    Follows with [facility]    HOME DIABETES REGIMEN:  Lantus 12 units Novolog I:C ratio  I:10 TIDQAD  CF :Novolog (BG- 100/50)     Statin: Yes ACE-I/ARB: yes    CONTINUOUS GLUCOSE MONITORING RECORD INTERPRETATION    Dates of Recording: 12/1-12/14/2024  Sensor description: Dexcom  Results statistics:   CGM use % of time 93  Average and SD 196/62  Time in range 46%  % Time Above 180 33  % Time above 250 21  % Time Below target 0     Glycemic patterns summary: BGs fluctuate throughout the day and night  Hyperglycemic episodes  postprandial   Hypoglycemic episodes occurred n/a  Overnight periods: Variable    DIABETIC COMPLICATIONS: Microvascular complications:  CKD III, Hx of retinoapthy at age 62 Denies: neuropathy Last eye exam: Completed 12/2021   Macrovascular complications:  CAD Denies: PVD, CVA   HISTORY:  Past Medical History:  Past Medical History:  Diagnosis Date    Coronary artery disease    Diabetes mellitus without complication (HCC)    Myocardial infarct (HCC)    2016   Past Surgical History:  Past Surgical History:  Procedure Laterality Date   CORONARY ARTERY BYPASS GRAFT     EYE SURGERY     TOOTH EXTRACTION     Social History:  reports that he has never smoked. He has never used smokeless tobacco. He reports that he does not drink alcohol and does not use drugs. Family History:  Family History  Problem Relation Age of Onset   CAD Father    Hypertension Father    Hypertension Other    Cancer Neg Hx    Diabetes Neg Hx    Stroke Neg Hx    Colon polyps Neg Hx    Colon cancer Neg Hx    Esophageal cancer Neg Hx    Rectal cancer Neg Hx    Stomach cancer Neg Hx      HOME MEDICATIONS: Allergies as of 04/02/2023   No Known Allergies      Medication List        Accurate as of April 02, 2023  1:20 PM. If you have any questions, ask your nurse or doctor.          acetaminophen 325 MG tablet Commonly known as: TYLENOL Take 2 tablets (650 mg total) by mouth every 4 (four) hours as needed for mild pain, moderate pain, fever  or headache.   amLODipine 10 MG tablet Commonly known as: NORVASC Take 1 tablet (10 mg total) by mouth daily. What changed:  how much to take additional instructions   amLODipine 5 MG tablet Commonly known as: NORVASC Take 5 mg by mouth daily. What changed: Another medication with the same name was changed. Make sure you understand how and when to take each.   atorvastatin 80 MG tablet Commonly known as: LIPITOR TAKE 1 TABLET BY MOUTH EVERY EVENING   calcitRIOL 0.25 MCG capsule Commonly known as: ROCALTROL Take 0.25 mcg by mouth 3 (three) times a week.   Dexcom G6 Sensor Misc 1 each by Does not apply route as directed.   Dexcom G6 Transmitter Misc 1 each by Does not apply route See admin instructions. Change transmitter every 90 days.   furosemide 40 MG tablet Commonly known as:  LASIX Take 40 mg by mouth 2 (two) times daily.   furosemide 20 MG tablet Commonly known as: LASIX TAKE 1 TABLET BY MOUTH EVERY DAY   glucagon 1 MG injection Inject 1 mg into the vein once as needed for up to 1 dose.   Insulin Pen Needle 32G X 4 MM Misc 1 Device by Does not apply route in the morning, at noon, in the evening, and at bedtime.   Lantus SoloStar 100 UNIT/ML Solostar Pen Generic drug: insulin glargine Inject 10 Units into the skin daily. What changed: how much to take   lisinopril 10 MG tablet Commonly known as: ZESTRIL Take 1 tablet (10 mg total) by mouth daily.   metoprolol tartrate 25 MG tablet Commonly known as: LOPRESSOR TAKE 1 TABLET BY MOUTH EVERY 8 HOURS   multivitamin with minerals Tabs tablet Take 1 tablet by mouth daily.   NovoLOG FlexPen 100 UNIT/ML FlexPen Generic drug: insulin aspart INJECT UNDER THE SKIN AS DIRECTED, MAX DAILY 50 UNITS         OBJECTIVE:   Vital Signs: BP 112/70 (BP Location: Left Arm, Patient Position: Sitting, Cuff Size: Small)   Pulse 98   Ht 5\' 7"  (1.702 m)   Wt 139 lb (63 kg)   SpO2 (!) 77%   BMI 21.77 kg/m   Wt Readings from Last 3 Encounters:  04/02/23 139 lb (63 kg)  06/26/22 131 lb 9.6 oz (59.7 kg)  05/14/22 130 lb (59 kg)     Exam: General: Pt appears well and is in NAD  Lungs: Clear with good BS bilat   Heart: RRR   Abdomen: soft, nontender  Extremities: No pretibial edema.   Neuro: MS is good with appropriate affect, pt is alert and Ox3    DM foot exam: 04/02/2023  The skin of the feet is intact without sores or ulcerations. The pedal pulses are 1+ on right and 1+ on left. The sensation is intact to a screening 5.07, 10 gram monofilament bilaterally    DATA REVIEWED:  Lab Results  Component Value Date   HGBA1C 7.4 (A) 04/02/2023   HGBA1C 7.9 (A) 06/26/2022   HGBA1C 8.0 (A) 01/30/2022    Latest Reference Range & Units 09/12/22 00:00 01/21/23 14:31  BASIC METABOLIC PANEL  Rpt ! (E)    COMPREHENSIVE METABOLIC PANEL  Rpt (E)   Sodium 137 - 147  137 (E)   Potassium 3.5 - 5.1 mEq/L 4.5 (E)   CO2 13 - 22  22 (E)   Glucose  86 (E)   BUN 4 - 21  60 ! (E)   Creatinine 0.6 - 1.3  2.7 ! (E)   Calcium 8.7 - 10.7  9.0 (E)   eGFR  28 (E) 29.0 (E)  Albumin 3.5 - 5.0  4.4 (E)     ASSESSMENT / PLAN / RECOMMENDATIONS:   1) Type 1 Diabetes Mellitus, Sub-Optimally controlled, With CKD III, retinopathy and macrovascular  complications - Most recent A1c of 7.4 %. Goal A1c < 7.5%.     -A1c continues to be above goal -I will adjust his insulin to carb ratio as below -Historically he has not been interested in insulin pump technology, I did encourage him to check out the iLet bionic pump -We also discussed the importance of keeping up with his visits, as the patient has not been here in a year.  We discussed the importance of at least being seen twice yearly -We again emphasized the importance of taking NovoLog before the meal  MEDICATIONS: -Continue Lantus 12 units daily  -Change Novolog I:C ratio to 1:10 with breakfast, 1:10 with lunch, 1:8 with supper -Continue CF :Novolog (BG- 100/50)     EDUCATION / INSTRUCTIONS: BG monitoring instructions: Patient is instructed to check his blood sugars 4 times a day, before meals and bedtime . Call [facility] if: BG persistently < 70 I reviewed the Rule of 15 for the treatment of hypoglycemia in detail with the patient. Literature supplied.    2) Diabetic complications:  Eye: Does have known diabetic retinopathy.  Neuro/ Feet: Does not have known diabetic peripheral neuropathy .  Renal: Patient does have known baseline CKD. He   is  on an ACEI/ARB at present. Urine albumin/creatinine ratio today is elevated     F/U in 6 months   I spent 25 minutes preparing to see the patient by review of recent labs, imaging and procedures, obtaining and reviewing separately obtained history, communicating with the patient/family  or caregiver, ordering medications, tests or procedures, and documenting clinical information in the EHR including the differential Dx, treatment, and any further evaluation and other management   Signed electronically by: Lyndle Herrlich, MD  [facility] Endocrinology  [facility] [address] [place] [place], [place] [address] Phone: [phone] FAX: [phone]   CC: Deeann Saint, MD [address] [place] [address] Phone: [phone]  Fax: [phone]  Return to Endocrinology clinic as below: No future appointments.

## 2023-04-02 NOTE — Patient Instructions (Addendum)
Please check out the Bionic Pancreas-Go Bionic - iLet Bionic Pancreas    -Continue lantus  12 units daily  -Novolog I:C ratio 1: 10 for Breakfast, 1:10 for Lunch and 1:8 for supper  -CF :Novolog (BG- 100/50)   HOW TO TREAT LOW BLOOD SUGARS (Blood sugar LESS THAN 70 MG/DL) Please follow the RULE OF 15 for the treatment of hypoglycemia treatment (when your (blood sugars are less than 70 mg/dL)   STEP 1: Take 15 grams of carbohydrates when your blood sugar is low, which includes:  3-4 GLUCOSE TABS  OR 3-4 OZ OF JUICE OR REGULAR SODA OR ONE TUBE OF GLUCOSE GEL    STEP 2: RECHECK blood sugar in 15 MINUTES STEP 3: If your blood sugar is still low at the 15 minute recheck --> then, go back to STEP 1 and treat AGAIN with another 15 grams of carbohydrates.

## 2023-04-03 ENCOUNTER — Other Ambulatory Visit: Payer: Self-pay | Admitting: Family Medicine

## 2023-04-03 DIAGNOSIS — I10 Essential (primary) hypertension: Secondary | ICD-10-CM

## 2023-04-22 DIAGNOSIS — E1065 Type 1 diabetes mellitus with hyperglycemia: Secondary | ICD-10-CM | POA: Diagnosis not present

## 2023-05-27 DIAGNOSIS — D631 Anemia in chronic kidney disease: Secondary | ICD-10-CM | POA: Diagnosis not present

## 2023-05-27 DIAGNOSIS — N184 Chronic kidney disease, stage 4 (severe): Secondary | ICD-10-CM | POA: Diagnosis not present

## 2023-05-27 DIAGNOSIS — R809 Proteinuria, unspecified: Secondary | ICD-10-CM | POA: Diagnosis not present

## 2023-05-27 DIAGNOSIS — N2581 Secondary hyperparathyroidism of renal origin: Secondary | ICD-10-CM | POA: Diagnosis not present

## 2023-05-27 DIAGNOSIS — I129 Hypertensive chronic kidney disease with stage 1 through stage 4 chronic kidney disease, or unspecified chronic kidney disease: Secondary | ICD-10-CM | POA: Diagnosis not present

## 2023-05-28 LAB — LAB REPORT - SCANNED
Calcium: 9.3
EGFR: 28
PTH: 194

## 2023-06-05 ENCOUNTER — Other Ambulatory Visit: Payer: Self-pay | Admitting: Internal Medicine

## 2023-06-06 ENCOUNTER — Other Ambulatory Visit: Payer: Self-pay

## 2023-07-14 DIAGNOSIS — E113593 Type 2 diabetes mellitus with proliferative diabetic retinopathy without macular edema, bilateral: Secondary | ICD-10-CM | POA: Diagnosis not present

## 2023-07-17 DIAGNOSIS — E1065 Type 1 diabetes mellitus with hyperglycemia: Secondary | ICD-10-CM | POA: Diagnosis not present

## 2023-09-25 DIAGNOSIS — N184 Chronic kidney disease, stage 4 (severe): Secondary | ICD-10-CM | POA: Diagnosis not present

## 2023-10-01 ENCOUNTER — Ambulatory Visit: Payer: BLUE CROSS/BLUE SHIELD | Admitting: Internal Medicine

## 2023-10-01 ENCOUNTER — Encounter: Payer: Self-pay | Admitting: Internal Medicine

## 2023-10-01 VITALS — BP 120/80 | HR 84 | Ht 67.0 in | Wt 133.0 lb

## 2023-10-01 DIAGNOSIS — N1832 Chronic kidney disease, stage 3b: Secondary | ICD-10-CM | POA: Diagnosis not present

## 2023-10-01 DIAGNOSIS — E1022 Type 1 diabetes mellitus with diabetic chronic kidney disease: Secondary | ICD-10-CM | POA: Diagnosis not present

## 2023-10-01 DIAGNOSIS — E10319 Type 1 diabetes mellitus with unspecified diabetic retinopathy without macular edema: Secondary | ICD-10-CM | POA: Diagnosis not present

## 2023-10-01 DIAGNOSIS — E1059 Type 1 diabetes mellitus with other circulatory complications: Secondary | ICD-10-CM

## 2023-10-01 LAB — POCT GLYCOSYLATED HEMOGLOBIN (HGB A1C): Hemoglobin A1C: 7.4 % — AB (ref 4.0–5.6)

## 2023-10-01 NOTE — Progress Notes (Signed)
 Name: Willie Fletcher  Age/ Sex: 53 y.o., male   MRN/ DOB: [id number], 1970-05-31     PCP: Viola Greulich, MD   Reason for Endocrinology Evaluation: Type 1 Diabetes Mellitus  Initial Endocrine Consultative Visit: 05/20/2018    PATIENT IDENTIFIER: Willie Fletcher is a 53 y.o. male with a past medical history of T1DM, HTN, CAD  and Dyslipidemia . The patient has followed with Endocrinology clinic since 05/20/2018 for consultative assistance with management of his diabetes.  DIABETIC HISTORY:  Mr. Leahy was diagnosed with T1DM in 1984. Moved from [place] because his [facility]'  His initial  hemoglobin A1c at our clinic was 6.6%.   SUBJECTIVE:   During the last visit (04/02/2023): A1c 7.4%     Today (10/01/2023): [name] is here for a follow up on diabetes managemnt. Willie Fletcher  He was checking  his blood sugars multiple times daily. The patient has had hypoglycemic episodes since the last clinic visit.  Denies nausea ,  vomiting , Denies constipation or  diarrhea    Follows with Willie Fletcher    HOME DIABETES REGIMEN:  Lantus  12 units Novolog  I:C ratio  I:10 with breakfast, 1:10 with lunch, 1:8 with supper CF :Novolog  (BG- 100/50)     Statin: Yes ACE-I/ARB: yes    CONTINUOUS GLUCOSE MONITORING RECORD INTERPRETATION    Dates of Recording: 6/5-6/18/2025  Sensor description: Dexcom  Results statistics:   CGM use % of time 97  Average and SD 183/71  Time in range 51%  % Time Above 180 27  % Time above 250 20  % Time Below target 2     Glycemic patterns summary: BGs fluctuate throughout the day and night  Hyperglycemic episodes  postprandial   Hypoglycemic episodes occurred n/a  Overnight periods: Variable    DIABETIC COMPLICATIONS: Microvascular complications:  CKD III, Hx of retinoapthy at age 47 Denies: neuropathy Last eye exam: Completed 12/2021   Macrovascular complications:  CAD Denies: PVD, CVA   HISTORY:  Past Medical History:  Past Medical  History:  Diagnosis Date   Coronary artery disease    Diabetes mellitus without complication (HCC)    Myocardial infarct (HCC)    2016   Past Surgical History:  Past Surgical History:  Procedure Laterality Date   CORONARY ARTERY BYPASS GRAFT     EYE SURGERY     TOOTH EXTRACTION     Social History:  reports that he has never smoked. He has never used smokeless tobacco. He reports that he does not drink alcohol and does not use drugs. Family History:  Family History  Problem Relation Age of Onset   CAD Father    Hypertension Father    Hypertension Other    Cancer Neg Hx    Diabetes Neg Hx    Stroke Neg Hx    Colon polyps Neg Hx    Colon cancer Neg Hx    Esophageal cancer Neg Hx    Rectal cancer Neg Hx    Stomach cancer Neg Hx      HOME MEDICATIONS: Allergies as of 10/01/2023   No Known Allergies      Medication List        Accurate as of October 01, 2023  2:13 PM. If you have any questions, ask your nurse or doctor.          acetaminophen  325 MG tablet Commonly known as: TYLENOL  Take 2 tablets (650 mg total) by mouth every 4 (four) hours as needed for  mild pain, moderate pain, fever or headache.   amLODipine  5 MG tablet Commonly known as: NORVASC  Take 5 mg by mouth daily. What changed: Another medication with the same name was removed. Continue taking this medication, and follow the directions you see here. Changed by: Camilla Cedar [place]   atorvastatin  80 MG tablet Commonly known as: LIPITOR  TAKE 1 TABLET BY MOUTH EVERY EVENING   calcitRIOL 0.25 MCG capsule Commonly known as: ROCALTROL Take 0.25 mcg by mouth 3 (three) times a week.   Dexcom G7 Sensor Misc 1 Device by Does not apply route as directed.   furosemide  40 MG tablet Commonly known as: LASIX  Take 40 mg by mouth 2 (two) times daily. What changed: Another medication with the same name was removed. Continue taking this medication, and follow the directions you see here. Changed by: Camilla Cedar  Yanuel Tagg   glucagon  1 MG injection Inject 1 mg into the vein once as needed for up to 1 dose.   Insulin  Pen Needle 32G X 4 MM Misc 1 Device by Does not apply route in the morning, at noon, in the evening, and at bedtime.   Lantus  SoloStar 100 UNIT/ML Solostar Pen Generic drug: insulin  glargine Inject 12 Units into the skin daily.   lisinopril  10 MG tablet Commonly known as: ZESTRIL  TAKE 1 TABLET BY MOUTH EVERY DAY   metoprolol  tartrate 25 MG tablet Commonly known as: LOPRESSOR  TAKE 1 TABLET BY MOUTH EVERY 8 HOURS   multivitamin with minerals Tabs tablet Take 1 tablet by mouth daily.   NovoLOG  FlexPen 100 UNIT/ML FlexPen Generic drug: insulin  aspart Max daily 45 units         OBJECTIVE:   Vital Signs: BP 120/80 (BP Location: Left Arm, Patient Position: Sitting, Cuff Size: Normal)   Pulse 84   Ht 5' 7 (1.702 m)   Wt 133 lb (60.3 kg)   SpO2 98%   BMI 20.83 kg/m   Wt Readings from Last 3 Encounters:  10/01/23 133 lb (60.3 kg)  04/02/23 139 lb (63 kg)  06/26/22 131 lb 9.6 oz (59.7 kg)     Exam: General: Pt appears well and is in NAD  Lungs: Clear with good BS bilat   Heart: RRR   Abdomen: soft, nontender Patient has been noted with lipo hypertrophy at the lower abdomen nasal area  Extremities: No pretibial edema.   Neuro: MS is good with appropriate affect, pt is alert and Ox3    DM foot exam: 04/02/2023  The skin of the feet is intact without sores or ulcerations. The pedal pulses are 1+ on right and 1+ on left. The sensation is intact to a screening 5.07, 10 gram monofilament bilaterally    DATA REVIEWED:  Lab Results  Component Value Date   HGBA1C 7.4 (A) 10/01/2023   HGBA1C 7.4 (A) 04/02/2023   HGBA1C 7.9 (A) 06/26/2022    05/28/23 12:55  PTH, Intact 194.0 (E)  (E): External lab result  05/28/23 12:55  Calcium  9.3 (E)  eGFR 28.0 (E)  (E): External lab result  ASSESSMENT / PLAN / RECOMMENDATIONS:   1) Type 1 Diabetes Mellitus,  Sub-Optimally controlled, With CKD III, retinopathy and macrovascular  complications - Most recent A1c of 7.4 %. Goal A1c < 7.5%.     -A1c continues to be above goal but stable - Patient continues with glycemic excursions -Last night he had hypoglycemic episode around 9 PM, he ate ice cream which resulted in hyperglycemia throughout the night, patient received multiple NovoLog  injections  without any improvement in his glucose which is very unusual for him as he is typically very sensitive to insulin  -Patient has been noted with Lipohypertrophic areas at the lower abdominal wall, patient was advised to avoid these areas for the next 6 months -We also discussed proper way of correcting for hypoglycemia and preventing overcorrection - I have recommended bionic eyelid pump, patient will return for fasting C-peptide and glucose   MEDICATIONS: -Continue Lantus  12 units daily  -Continue  Novolog  I:C ratio to 1:10 with breakfast, 1:10 with lunch, 1:8 with supper -Continue CF :Novolog  (BG- 100/50)     EDUCATION / INSTRUCTIONS: BG monitoring instructions: Patient is instructed to check his blood sugars 4 times a day, before meals and bedtime . Call [facility] if: BG persistently < 70 I reviewed the Rule of 15 for the treatment of hypoglycemia in detail with the patient. Literature supplied.    2) Diabetic complications:  Eye: Does have known diabetic retinopathy.  Neuro/ Feet: Does not have known diabetic peripheral neuropathy .  Renal: Patient does have known baseline CKD. He   is  on an ACEI/ARB at present. Urine albumin/creatinine ratio today is elevated     F/U in 6 months   I spent 25 minutes preparing to see the patient by review of recent labs, imaging and procedures, obtaining and reviewing separately obtained history, communicating with the patient/family or caregiver, ordering medications, tests or procedures, and documenting clinical information in the EHR  including the differential Dx, treatment, and any further evaluation and other management    Signed electronically by: Natale Bail, MD  [facility] Endocrinology  [facility] [address] [place], [place] [address] Phone: [phone] FAX: [phone]   CC: Viola Greulich, MD [address] [place] [address] Phone: [phone]  Fax: [phone]  Return to Endocrinology clinic as below: Future Appointments  Date Time Provider Department Center  10/01/2023  2:20 PM Beau Vanduzer, Julian Obey, MD [facility] None

## 2023-10-01 NOTE — Patient Instructions (Addendum)
 But stable -Continue lantus   12 units daily  -Novolog  I:C ratio 1: 10 for Breakfast, 1:10 for Lunch and 1:8 for supper  -CF :Novolog  (BG- 100/50)   HOW TO TREAT LOW BLOOD SUGARS (Blood sugar LESS THAN 70 MG/DL) Please follow the RULE OF 15 for the treatment of hypoglycemia treatment (when your (blood sugars are less than 70 mg/dL)   STEP 1: Take 15 grams of carbohydrates when your blood sugar is low, which includes:  3-4 GLUCOSE TABS  OR 3-4 OZ OF JUICE OR REGULAR SODA OR ONE TUBE OF GLUCOSE GEL    STEP 2: RECHECK blood sugar in 15 MINUTES STEP 3: If your blood sugar is still low at the 15 minute recheck --> then, go back to STEP 1 and treat AGAIN with another 15 grams of carbohydrates.

## 2023-10-02 DIAGNOSIS — D631 Anemia in chronic kidney disease: Secondary | ICD-10-CM | POA: Diagnosis not present

## 2023-10-02 DIAGNOSIS — R809 Proteinuria, unspecified: Secondary | ICD-10-CM | POA: Diagnosis not present

## 2023-10-02 DIAGNOSIS — N184 Chronic kidney disease, stage 4 (severe): Secondary | ICD-10-CM | POA: Diagnosis not present

## 2023-10-02 DIAGNOSIS — I129 Hypertensive chronic kidney disease with stage 1 through stage 4 chronic kidney disease, or unspecified chronic kidney disease: Secondary | ICD-10-CM | POA: Diagnosis not present

## 2023-10-10 ENCOUNTER — Other Ambulatory Visit

## 2023-10-10 DIAGNOSIS — E1022 Type 1 diabetes mellitus with diabetic chronic kidney disease: Secondary | ICD-10-CM | POA: Diagnosis not present

## 2023-10-10 DIAGNOSIS — N1832 Chronic kidney disease, stage 3b: Secondary | ICD-10-CM | POA: Diagnosis not present

## 2023-10-11 LAB — BASIC METABOLIC PANEL WITH GFR
BUN/Creatinine Ratio: 18 (calc) (ref 6–22)
BUN: 52 mg/dL — ABNORMAL HIGH (ref 7–25)
CO2: 27 mmol/L (ref 20–32)
Calcium: 8.8 mg/dL (ref 8.6–10.3)
Chloride: 99 mmol/L (ref 98–110)
Creat: 2.87 mg/dL — ABNORMAL HIGH (ref 0.70–1.30)
Glucose, Bld: 152 mg/dL — ABNORMAL HIGH (ref 65–99)
Potassium: 5.1 mmol/L (ref 3.5–5.3)
Sodium: 132 mmol/L — ABNORMAL LOW (ref 135–146)
eGFR: 25 mL/min/{1.73_m2} — ABNORMAL LOW (ref >=60)

## 2023-10-11 LAB — C-PEPTIDE: C-Peptide: 0.14 ng/mL — ABNORMAL LOW (ref 0.80–3.85)

## 2023-10-13 ENCOUNTER — Ambulatory Visit: Payer: Self-pay

## 2023-10-13 ENCOUNTER — Encounter: Payer: Self-pay | Admitting: Internal Medicine

## 2023-10-27 DIAGNOSIS — E1065 Type 1 diabetes mellitus with hyperglycemia: Secondary | ICD-10-CM | POA: Diagnosis not present

## 2023-11-04 ENCOUNTER — Other Ambulatory Visit: Payer: Self-pay | Admitting: Family Medicine

## 2023-11-04 DIAGNOSIS — I10 Essential (primary) hypertension: Secondary | ICD-10-CM

## 2023-11-05 ENCOUNTER — Telehealth: Payer: Self-pay

## 2023-11-05 NOTE — Telephone Encounter (Signed)
 Did you complete the paperwork and send off for the Beta Bionic pump?

## 2023-11-05 NOTE — Telephone Encounter (Signed)
 No, you need the orders signed by Dr Sam.  I may have some here.

## 2023-11-06 NOTE — Telephone Encounter (Signed)
 [facility] order on Dr. Sam desk for [employer]

## 2023-11-11 ENCOUNTER — Encounter: Payer: Self-pay | Admitting: Family Medicine

## 2023-11-27 ENCOUNTER — Ambulatory Visit: Admitting: Family Medicine

## 2023-11-27 ENCOUNTER — Other Ambulatory Visit: Payer: Self-pay | Admitting: Family Medicine

## 2023-11-27 DIAGNOSIS — I10 Essential (primary) hypertension: Secondary | ICD-10-CM

## 2023-11-27 DIAGNOSIS — B070 Plantar wart: Secondary | ICD-10-CM | POA: Diagnosis not present

## 2023-11-27 DIAGNOSIS — E1059 Type 1 diabetes mellitus with other circulatory complications: Secondary | ICD-10-CM | POA: Diagnosis not present

## 2023-11-27 NOTE — Progress Notes (Signed)
 Established Patient Office Visit   Subjective  Patient ID: Willie Fletcher, male    DOB: 1970-09-07  Age: 53 y.o. MRN: [id number]  Chief Complaint  Patient presents with   Acute Visit    Foot pain, plantar wart on the left medial side of the foot started 3 weeks ago, patient is having pain when walking for a long time, rates the pain 4 out of 10     Patient is a 53 year old male seen for acute concern.  Patient endorses bump on bottom of left foot x 3 weeks.  Painful, 4/10 discomfort.  Has not Pt from working or doing activities.  Patient states blood sugar has been well-controlled.  Has an appointment in a few weeks for CPE with this provider.    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   CKD (chronic kidney disease), stage III (HCC) 03/23/2018   Past Medical History:  Diagnosis Date   Coronary artery disease    Diabetes mellitus without complication (HCC)    Myocardial infarct (HCC)    2016   Past Surgical History:  Procedure Laterality Date   CORONARY ARTERY BYPASS GRAFT     EYE SURGERY     TOOTH EXTRACTION     Social History   Tobacco Use   Smoking status: Never   Smokeless tobacco: Never  Vaping Use   Vaping status: Never Used  Substance Use Topics   Alcohol use: Never   Drug use: Never   Family History  Problem Relation Age of Onset   CAD Father    Hypertension Father    Hypertension Other    Cancer Neg Hx    Diabetes  Neg Hx    Stroke Neg Hx    Colon polyps Neg Hx    Colon cancer Neg Hx    Esophageal cancer Neg Hx    Rectal cancer Neg Hx    Stomach cancer Neg Hx    No Known Allergies  ROS Negative unless stated above    Objective:     There were no vitals taken for this visit. BP Readings from Last 3 Encounters:  10/01/23 120/80  04/02/23 112/70  06/26/22 120/70   Wt Readings from Last 3 Encounters:  10/01/23 133 lb (60.3 kg)  04/02/23 139 lb (63 kg)  06/26/22 131 lb 9.6 oz (59.7 kg)      Physical Exam Constitutional:      Appearance: Normal appearance.  HENT:     Head: Normocephalic and atraumatic.     Mouth/Throat:     Mouth: Mucous membranes are moist.  Cardiovascular:     Rate and Rhythm: Normal rate.  Pulmonary:     Effort: Pulmonary effort is normal.  Musculoskeletal:        General: Normal range of motion.       Feet:  Feet:     Comments: 7 mm plantar wart on plantar surface of left foot Skin:    General: Skin is warm  and dry.     Comments: Plantar wart on plantar surface of left foot at fifth metatarsal.  Hyperpigmentation by lateral anterior lower legs.  Neurological:     Mental Status: He is alert and oriented to person, place, and time.        12/26/2021    1:38 PM  Depression screen PHQ 2/9  Decreased Interest 0  Down, Depressed, Hopeless 0  PHQ - 2 Score 0  Altered sleeping 1  Tired, decreased energy 1  Change in appetite 0  Feeling bad or failure about yourself  0  Trouble concentrating 0  Moving slowly or fidgety/restless 0  Suicidal thoughts 0  PHQ-9 Score 2  Difficult doing work/chores Not difficult at all       No data to display           No results found for any visits on 11/27/23.    Assessment & Plan:   Plantar wart, left foot  Type 1 diabetes mellitus with other circulatory complication (HCC)  Plantar wart of left foot.  Discussed various treatment options including OTC, cryotherapy, etc.  Wishes to use OTC products.  Continue to monitor.  Return if symptoms worsen or fail to improve.   Clotilda JONELLE Single, MD

## 2023-12-03 ENCOUNTER — Encounter: Payer: Self-pay | Admitting: Family Medicine

## 2023-12-03 ENCOUNTER — Ambulatory Visit: Admitting: Family Medicine

## 2023-12-03 VITALS — BP 122/64 | HR 70 | Temp 98.4°F | Ht 67.0 in | Wt 136.2 lb

## 2023-12-03 DIAGNOSIS — E1059 Type 1 diabetes mellitus with other circulatory complications: Secondary | ICD-10-CM

## 2023-12-03 DIAGNOSIS — N184 Chronic kidney disease, stage 4 (severe): Secondary | ICD-10-CM

## 2023-12-03 DIAGNOSIS — Z125 Encounter for screening for malignant neoplasm of prostate: Secondary | ICD-10-CM | POA: Diagnosis not present

## 2023-12-03 DIAGNOSIS — Z0000 Encounter for general adult medical examination without abnormal findings: Secondary | ICD-10-CM | POA: Diagnosis not present

## 2023-12-03 DIAGNOSIS — I2510 Atherosclerotic heart disease of native coronary artery without angina pectoris: Secondary | ICD-10-CM

## 2023-12-03 DIAGNOSIS — M25551 Pain in right hip: Secondary | ICD-10-CM

## 2023-12-03 DIAGNOSIS — I2583 Coronary atherosclerosis due to lipid rich plaque: Secondary | ICD-10-CM

## 2023-12-03 DIAGNOSIS — I10 Essential (primary) hypertension: Secondary | ICD-10-CM

## 2023-12-03 LAB — LIPID PANEL
Cholesterol: 181 mg/dL (ref 0–200)
HDL: 51.8 mg/dL (ref >39.00)
LDL Cholesterol: 98 mg/dL (ref 0–99)
NonHDL: 129.62
Total CHOL/HDL Ratio: 4
Triglycerides: 157 mg/dL — ABNORMAL HIGH (ref 0.0–149.0)
VLDL: 31.4 mg/dL (ref 0.0–40.0)

## 2023-12-03 LAB — CBC WITH DIFFERENTIAL/PLATELET
Basophils Absolute: 0.1 K/uL (ref 0.0–0.1)
Basophils Relative: 1.4 % (ref 0.0–3.0)
Eosinophils Absolute: 0.3 K/uL (ref 0.0–0.7)
Eosinophils Relative: 8.5 % — ABNORMAL HIGH (ref 0.0–5.0)
HCT: 34.3 % — ABNORMAL LOW (ref 39.0–52.0)
Hemoglobin: 11.8 g/dL — ABNORMAL LOW (ref 13.0–17.0)
Lymphocytes Relative: 23.2 % (ref 12.0–46.0)
Lymphs Abs: 0.9 K/uL (ref 0.7–4.0)
MCHC: 34.5 g/dL (ref 30.0–36.0)
MCV: 90.3 fl (ref 78.0–100.0)
Monocytes Absolute: 0.3 K/uL (ref 0.1–1.0)
Monocytes Relative: 8.5 % (ref 3.0–12.0)
Neutro Abs: 2.3 K/uL (ref 1.4–7.7)
Neutrophils Relative %: 58.4 % (ref 43.0–77.0)
Platelets: 234 K/uL (ref 150.0–400.0)
RBC: 3.8 Mil/uL — ABNORMAL LOW (ref 4.22–5.81)
RDW: 12.5 % (ref 11.5–15.5)
WBC: 4 K/uL (ref 4.0–10.5)

## 2023-12-03 LAB — COMPREHENSIVE METABOLIC PANEL WITH GFR
ALT: 13 U/L (ref 0–53)
AST: 19 U/L (ref 0–37)
Albumin: 4.5 g/dL (ref 3.5–5.2)
Alkaline Phosphatase: 58 U/L (ref 39–117)
BUN: 64 mg/dL — ABNORMAL HIGH (ref 6–23)
CO2: 29 meq/L (ref 19–32)
Calcium: 9.3 mg/dL (ref 8.4–10.5)
Chloride: 96 meq/L (ref 96–112)
Creatinine, Ser: 2.96 mg/dL — ABNORMAL HIGH (ref 0.40–1.50)
GFR: 23.21 mL/min — ABNORMAL LOW (ref >60.00)
Glucose, Bld: 63 mg/dL — ABNORMAL LOW (ref 70–99)
Potassium: 4.6 meq/L (ref 3.5–5.1)
Sodium: 134 meq/L — ABNORMAL LOW (ref 135–145)
Total Bilirubin: 0.8 mg/dL (ref 0.2–1.2)
Total Protein: 7.2 g/dL (ref 6.0–8.3)

## 2023-12-03 LAB — HEMOGLOBIN A1C: Hgb A1c MFr Bld: 8.2 % — ABNORMAL HIGH (ref 4.6–6.5)

## 2023-12-03 LAB — PSA: PSA: 0.83 ng/mL (ref 0.10–4.00)

## 2023-12-03 LAB — MICROALBUMIN / CREATININE URINE RATIO
Creatinine,U: 58.6 mg/dL
Microalb Creat Ratio: 13.9 mg/g (ref 0.0–30.0)
Microalb, Ur: 0.8 mg/dL (ref 0.0–1.9)

## 2023-12-03 LAB — T4, FREE: Free T4: 0.9 ng/dL (ref 0.60–1.60)

## 2023-12-03 LAB — TSH: TSH: 1.56 u[IU]/mL (ref 0.35–5.50)

## 2023-12-03 MED ORDER — AMLODIPINE BESYLATE 5 MG PO TABS
5.0000 mg | ORAL_TABLET | Freq: Every day | ORAL | 3 refills | Status: AC
Start: 2023-12-03 — End: ?

## 2023-12-03 MED ORDER — LISINOPRIL 10 MG PO TABS: 10.0000 mg | ORAL_TABLET | Freq: Every day | ORAL | 3 refills | Status: AC

## 2023-12-03 MED ORDER — ATORVASTATIN CALCIUM 80 MG PO TABS: 80.0000 mg | ORAL_TABLET | Freq: Every evening | ORAL | 3 refills | Status: AC

## 2023-12-03 MED ORDER — METOPROLOL TARTRATE 25 MG PO TABS: 25.0000 mg | ORAL_TABLET | Freq: Three times a day (TID) | ORAL | 3 refills | Status: AC

## 2023-12-03 NOTE — Progress Notes (Signed)
 Established Patient Office Visit   Subjective  Patient ID: Willie Fletcher, male    DOB: July 10, 1970  Age: 53 y.o. MRN: [id number]  Chief Complaint  Patient presents with   Annual Exam    Patient is a 53 year old male seen for CPE.  Patient doing well overall.  Since last OFV obtain OTC circular medicated pads for left foot plantar wart.  Notes some improvement is no longer painful and may be smaller.  Patient notes continued occasional right hip pain laterally and anterior thigh pain.  Described as more of an ache.  Notices more on longer walks around the store.  Does not notice doing short walks.  Denies instability, grinding, or popping in joint.  For diabetic retinopathy earlier this year.  Requesting refill on atorvastatin  as was unable to request it from the pharmacy.    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   CKD (chronic kidney disease), stage III (HCC) 03/23/2018   Past Medical History:  Diagnosis Date   Chronic kidney disease    Coronary artery disease    Diabetes mellitus without complication (HCC)    Hypertension    Myocardial infarct (HCC)    2016   Past Surgical History:  Procedure Laterality Date   APPENDECTOMY     CORONARY ARTERY BYPASS GRAFT     EYE SURGERY     TOOTH EXTRACTION     Social History   Tobacco Use   Smoking status: Never   Smokeless tobacco: Never  Vaping Use   Vaping status: Never Used  Substance Use  Topics   Alcohol use: Never   Drug use: Never   Family History  Problem Relation Age of Onset   CAD Father    Hypertension Father    Hearing loss Father    Heart disease Father    Hypertension Other    Cancer Neg Hx    Diabetes Neg Hx    Stroke Neg Hx    Colon polyps Neg Hx    Colon cancer Neg Hx    Esophageal cancer Neg Hx    Rectal cancer Neg Hx    Stomach cancer Neg Hx    No Known Allergies  ROS Negative unless stated above    Objective:     BP 122/64 (BP Location: Left Arm, Patient Position: Sitting, Cuff Size: Normal)   Pulse 70   Temp 98.4 F (36.9 C) (Oral)   Ht 5' 7 (1.702 m)   Wt 136 lb 3.2 oz (61.8 kg)   SpO2 99%   BMI 21.33 kg/m  BP Readings from Last 3 Encounters:  12/03/23 122/64  10/01/23 120/80  04/02/23 112/70   Wt Readings from Last 3 Encounters:  12/03/23 136 lb 3.2 oz (61.8 kg)  10/01/23 133 lb (60.3 kg)  04/02/23 139 lb (63 kg)      Physical Exam Constitutional:      Appearance: Normal appearance.  HENT:     Head: Normocephalic and atraumatic.  Right Ear: Tympanic membrane, ear canal and external ear normal.     Left Ear: Tympanic membrane, ear canal and external ear normal.     Nose: Nose normal.     Mouth/Throat:     Mouth: Mucous membranes are moist.     Pharynx: No oropharyngeal exudate or posterior oropharyngeal erythema.  Eyes:     General: No scleral icterus.    Extraocular Movements: Extraocular movements intact.     Conjunctiva/sclera: Conjunctivae normal.     Pupils: Pupils are equal, round, and reactive to light.  Neck:     Thyroid: No thyromegaly.     Vascular: No carotid bruit.  Cardiovascular:     Rate and Rhythm: Normal rate and regular rhythm.     Pulses: Normal pulses.     Heart sounds: Normal heart sounds. No murmur heard.    No friction rub.  Pulmonary:     Effort: Pulmonary effort is normal.     Breath sounds: Normal breath sounds. No wheezing, rhonchi or rales.  Abdominal:     General: Bowel  sounds are normal.     Palpations: Abdomen is soft.     Tenderness: There is no abdominal tenderness.  Musculoskeletal:        General: No deformity. Normal range of motion.     Comments: Negative logroll, straight leg raise, FADIR, FABER of RLE.  85 degrees abduction RLE.  Lymphadenopathy:     Cervical: No cervical adenopathy.  Skin:    General: Skin is warm and dry.     Findings: No lesion.     Comments: Hyperpigmented areas on bilateral LEs.  Neurological:     General: No focal deficit present.     Mental Status: He is alert and oriented to person, place, and time.  Psychiatric:        Mood and Affect: Mood normal.        Thought Content: Thought content normal.        12/03/2023    2:37 PM 11/27/2023    3:01 PM 12/26/2021    1:38 PM  Depression screen PHQ 2/9  Decreased Interest 1 1 0  Down, Depressed, Hopeless 1 1 0  PHQ - 2 Score 2 2 0  Altered sleeping 0 1 1  Tired, decreased energy 1 1 1   Change in appetite 0 0 0  Feeling bad or failure about yourself  0 0 0  Trouble concentrating 0 0 0  Moving slowly or fidgety/restless 0 0 0  Suicidal thoughts 0 0 0  PHQ-9 Score 3 4 2   Difficult doing work/chores Not difficult at all Not difficult at all Not difficult at all      12/03/2023    2:37 PM 11/27/2023    3:01 PM  GAD 7 : Generalized Anxiety Score  Nervous, Anxious, on Edge 1 1  Control/stop worrying 0 0  Worry too much - different things 1 1  Trouble relaxing 0 0  Restless 0 0  Easily annoyed or irritable 1 1  Afraid - awful might happen 0 0  Total GAD 7 Score 3 3  Anxiety Difficulty Not difficult at all Not difficult at all     No results found for any visits on 12/03/23.    Assessment & Plan:   Well adult exam -     CBC with Differential/Platelet; Future -     Comprehensive metabolic panel with GFR; Future -     Hemoglobin A1c; Future -     Lipid  panel; Future -     TSH; Future -     T4, free; Future  Essential hypertension -     amLODIPine   Besylate; Take 1 tablet (5 mg total) by mouth daily.  Dispense: 90 tablet; Refill: 3 -     Metoprolol  Tartrate; Take 1 tablet (25 mg total) by mouth every 8 (eight) hours.  Dispense: 270 tablet; Refill: 3 -     Lisinopril ; Take 1 tablet (10 mg total) by mouth daily. Appt needed.  Dispense: 90 tablet; Refill: 3 -     Comprehensive metabolic panel with GFR; Future -     TSH; Future -     T4, free; Future  CKD (chronic kidney disease), stage IV (HCC) -     Comprehensive metabolic panel with GFR; Future  Type 1 diabetes mellitus with other circulatory complication (HCC) -     Microalbumin / creatinine urine ratio -     Hemoglobin A1c; Future  Coronary artery disease due to lipid rich plaque -     Atorvastatin  Calcium ; Take 1 tablet (80 mg total) by mouth every evening.  Dispense: 90 tablet; Refill: 3 -     Lipid panel; Future  Screening for prostate cancer -     PSA; Future  Right hip pain  Age-appropriate health screenings discussed.  Obtain labs.  Immunizations reviewed.  Consider influenza vaccine when available.  BP well-controlled.  Continue current medications.  Continue atorvastatin  80 mg.  Obtain microalbumin.  Foot exam due in December.  Supportive care for right hip bursitis including ice, heat, stretching, topical analgesics, Tylenol .  Return in about 1 year (around 12/02/2024) for physical, 3-74-month follow-up for chronic conditions, sooner if needed.   Clotilda JONELLE Single, MD

## 2023-12-05 ENCOUNTER — Ambulatory Visit: Payer: Self-pay | Admitting: Family Medicine

## 2024-01-12 DIAGNOSIS — E113593 Type 2 diabetes mellitus with proliferative diabetic retinopathy without macular edema, bilateral: Secondary | ICD-10-CM | POA: Diagnosis not present

## 2024-01-21 DIAGNOSIS — E1065 Type 1 diabetes mellitus with hyperglycemia: Secondary | ICD-10-CM | POA: Diagnosis not present

## 2024-01-29 DIAGNOSIS — N184 Chronic kidney disease, stage 4 (severe): Secondary | ICD-10-CM | POA: Diagnosis not present

## 2024-04-14 ENCOUNTER — Encounter: Payer: Self-pay | Admitting: Internal Medicine

## 2024-04-14 ENCOUNTER — Ambulatory Visit: Admitting: Internal Medicine

## 2024-04-14 VITALS — BP 128/84 | Ht 67.0 in | Wt 137.0 lb

## 2024-04-14 DIAGNOSIS — E1059 Type 1 diabetes mellitus with other circulatory complications: Secondary | ICD-10-CM

## 2024-04-14 DIAGNOSIS — N1832 Chronic kidney disease, stage 3b: Secondary | ICD-10-CM

## 2024-04-14 DIAGNOSIS — E10319 Type 1 diabetes mellitus with unspecified diabetic retinopathy without macular edema: Secondary | ICD-10-CM | POA: Diagnosis not present

## 2024-04-14 DIAGNOSIS — E1022 Type 1 diabetes mellitus with diabetic chronic kidney disease: Secondary | ICD-10-CM

## 2024-04-14 LAB — POCT GLYCOSYLATED HEMOGLOBIN (HGB A1C): Hemoglobin A1C: 7.5 % — AB (ref 4.0–5.6)

## 2024-04-14 MED ORDER — DEXCOM G7 SENSOR MISC: 1.0000 | 3 refills | Status: AC

## 2024-04-14 MED ORDER — LANTUS SOLOSTAR 100 UNIT/ML ~~LOC~~ SOPN: 11.0000 [IU] | PEN_INJECTOR | Freq: Every day | SUBCUTANEOUS | 2 refills | Status: AC

## 2024-04-14 MED ORDER — NOVOLOG FLEXPEN 100 UNIT/ML ~~LOC~~ SOPN: PEN_INJECTOR | SUBCUTANEOUS | 2 refills | Status: AC

## 2024-04-14 MED ORDER — INSULIN PEN NEEDLE 32G X 4 MM MISC: 1.0000 | Freq: Four times a day (QID) | 3 refills | Status: AC

## 2024-04-14 NOTE — Patient Instructions (Addendum)
" °-  Continue Lantus  11 units daily  -Novolog  I:C ratio 1: 12 for Breakfast, 1:12 for Lunch and 1:8 for supper  -CF :Novolog  (BG- 100/50)   HOW TO TREAT LOW BLOOD SUGARS (Blood sugar LESS THAN 70 MG/DL) Please follow the RULE OF 15 for the treatment of hypoglycemia treatment (when your (blood sugars are less than 70 mg/dL)   STEP 1: Take 15 grams of carbohydrates when your blood sugar is low, which includes:  3-4 GLUCOSE TABS  OR 3-4 OZ OF JUICE OR REGULAR SODA OR ONE TUBE OF GLUCOSE GEL    STEP 2: RECHECK blood sugar in 15 MINUTES STEP 3: If your blood sugar is still low at the 15 minute recheck --> then, go back to STEP 1 and treat AGAIN with another 15 grams of carbohydrates. "

## 2024-04-14 NOTE — Progress Notes (Signed)
 "      Name: Willie Fletcher  Age/ Sex: 53 y.o., male   MRN/ DOB: [id number], 05/07/1970     PCP: Mercer Clotilda SAUNDERS, MD   Reason for Endocrinology Evaluation: Type 1 Diabetes Mellitus  Initial Endocrine Consultative Visit: 05/20/2018    PATIENT IDENTIFIER: Willie Fletcher is a 53 y.o. male with a past medical history of T1DM, HTN, CAD  and Dyslipidemia . The patient has followed with Endocrinology clinic since 05/20/2018 for consultative assistance with management of his diabetes.  DIABETIC HISTORY:  Willie Fletcher was diagnosed with T1DM in 1984. Moved from [place] because his [place]  His initial  hemoglobin A1c at our clinic was 6.6%.   SUBJECTIVE:   During the last visit (10/01/2023): A1c 7.4%     Today (04/14/2024): Willie Fletcher is here for a follow up on diabetes managemnt. SABRA  He was checking  his blood sugars multiple times daily. The patient has had hypoglycemic episodes since the last clinic visit.he is Tree Surgeon with [employer]  No nausea  No constipation or diarrhea    HOME DIABETES REGIMEN:  Lantus  11 units Novolog  I:C ratio  I:12 with breakfast, 1:12 with lunch, 1:8 with supper CF :Novolog  (BG- 100/50)    Statin: Yes ACE-I/ARB: yes    CONTINUOUS GLUCOSE MONITORING RECORD INTERPRETATION    Dates of Recording: 12/18-12/31/2025  Sensor description: Dexcom  Results statistics:   CGM use % of time 94  Average and SD 178/54  Time in range 53%  % Time Above 180 37  % Time above 250 9  % Time Below target 1     Glycemic patterns summary: BGs trend down overnight and fluctuate during the day Hyperglycemic episodes  postprandial   Hypoglycemic episodes occurred n/a  Overnight periods: Mostly optimal    DIABETIC COMPLICATIONS: Microvascular complications:  CKD III, Hx of retinoapthy at age 6 Denies: neuropathy Last eye exam: Completed 12/2021   Macrovascular complications:  CAD Denies: PVD, CVA   HISTORY:  Past Medical History:  Past  Medical History:  Diagnosis Date   Chronic kidney disease    Coronary artery disease    Diabetes mellitus without complication (HCC)    Hypertension    Myocardial infarct (HCC)    2016   Past Surgical History:  Past Surgical History:  Procedure Laterality Date   APPENDECTOMY     CORONARY ARTERY BYPASS GRAFT     EYE SURGERY     TOOTH EXTRACTION     Social History:  reports that he has never smoked. He has never used smokeless tobacco. He reports that he does not drink alcohol and does not use drugs. Family History:  Family History  Problem Relation Age of Onset   CAD Father    Hypertension Father    Hearing loss Father    Heart disease Father    Hypertension Other    Cancer Neg Hx    Diabetes Neg Hx    Stroke Neg Hx    Colon polyps Neg Hx    Colon cancer Neg Hx    Esophageal cancer Neg Hx    Rectal cancer Neg Hx    Stomach cancer Neg Hx      HOME MEDICATIONS: Allergies as of 04/14/2024   No Known Allergies      Medication List        Accurate as of April 14, 2024  2:48 PM. If you have any questions, ask your nurse or doctor.  acetaminophen  325 MG tablet Commonly known as: TYLENOL  Take 2 tablets (650 mg total) by mouth every 4 (four) hours as needed for mild pain, moderate pain, fever or headache.   amLODipine  5 MG tablet Commonly known as: NORVASC  Take 1 tablet (5 mg total) by mouth daily.   atorvastatin  80 MG tablet Commonly known as: LIPITOR  Take 1 tablet (80 mg total) by mouth every evening.   calcitRIOL 0.25 MCG capsule Commonly known as: ROCALTROL Take 0.25 mcg by mouth 3 (three) times a week.   Dexcom G7 Sensor Misc 1 Device by Does not apply route as directed.   furosemide  40 MG tablet Commonly known as: LASIX  Take 40 mg by mouth 2 (two) times daily.   glucagon  1 MG injection Inject 1 mg into the vein once as needed for up to 1 dose.   Insulin  Pen Needle 32G X 4 MM Misc 1 Device by Does not apply route in the morning, at  noon, in the evening, and at bedtime.   Lantus  SoloStar 100 UNIT/ML Solostar Pen Generic drug: insulin  glargine Inject 12 Units into the skin daily.   lisinopril  10 MG tablet Commonly known as: ZESTRIL  Take 1 tablet (10 mg total) by mouth daily. Appt needed.   metoprolol  tartrate 25 MG tablet Commonly known as: LOPRESSOR  Take 1 tablet (25 mg total) by mouth every 8 (eight) hours.   multivitamin with minerals Tabs tablet Take 1 tablet by mouth daily.   NovoLOG  FlexPen 100 UNIT/ML FlexPen Generic drug: insulin  aspart Max daily 45 units         OBJECTIVE:   Vital Signs: BP 128/84   Ht 5' 7 (1.702 m)   Wt 137 lb (62.1 kg)   BMI 21.46 kg/m   Wt Readings from Last 3 Encounters:  04/14/24 137 lb (62.1 kg)  12/03/23 136 lb 3.2 oz (61.8 kg)  10/01/23 133 lb (60.3 kg)     Exam: General: Pt appears well and is in NAD  Lungs: Clear with good BS bilat   Heart: RRR   Abdomen: soft, nontender Patient has been noted with lipo hypertrophy at the lower abdomen nasal area  Extremities: No pretibial edema.   Neuro: MS is good with appropriate affect, pt is alert and Ox3    DM foot exam: 04/02/2023  The skin of the feet is intact without sores or ulcerations. The pedal pulses are 1+ on right and 1+ on left. The sensation is intact to a screening 5.07, 10 gram monofilament bilaterally    DATA REVIEWED:  Lab Results  Component Value Date   HGBA1C 7.5 (A) 04/14/2024   HGBA1C 8.2 (H) 12/03/2023   HGBA1C 7.4 (A) 10/01/2023    Latest Reference Range & Units 12/03/23 14:35  Sodium 135 - 145 mEq/L 134 (L)  Potassium 3.5 - 5.1 mEq/L 4.6  Chloride 96 - 112 mEq/L 96  CO2 19 - 32 mEq/L 29  Glucose 70 - 99 mg/dL 63 (L)  BUN 6 - 23 mg/dL 64 (H)  Creatinine 9.59 - 1.50 mg/dL 7.03 (H)  Calcium  8.4 - 10.5 mg/dL 9.3  Alkaline Phosphatase 39 - 117 U/L 58  Albumin 3.5 - 5.2 g/dL 4.5  AST 0 - 37 U/L 19  ALT 0 - 53 U/L 13  Total Protein 6.0 - 8.3 g/dL 7.2  Total Bilirubin 0.2 -  1.2 mg/dL 0.8  GFR >39.99 mL/min 23.21 (L)    Latest Reference Range & Units 12/03/23 14:35  Total CHOL/HDL Ratio  4  Cholesterol 0 -  200 mg/dL 818  HDL Cholesterol >60.99 mg/dL 48.19  LDL (calc) 0 - 99 mg/dL 98  NonHDL  870.37  Triglycerides 0.0 - 149.0 mg/dL 842.9 (H)  VLDL 0.0 - 59.9 mg/dL 68.5     Latest Reference Range & Units 12/03/23 14:35  TSH 0.35 - 5.50 uIU/mL 1.56  T4,Free(Direct) 0.60 - 1.60 ng/dL 9.09    Latest Reference Range & Units 12/03/23 14:20  Creatinine,U mg/dL 41.3  Microalb, Ur 0.0 - 1.9 mg/dL 0.8  MICROALB/CREAT RATIO 0.0 - 30.0 mg/g 13.9    Latest Reference Range & Units 10/10/23 08:27  Glucose 65 - 99 mg/dL 847 (H)  C-Peptide 9.19 - 3.85 ng/mL 0.14 (L)  (H): Data is abnormally high (L): Data is abnormally low   Old records , labs and images have been reviewed.    ASSESSMENT / PLAN / RECOMMENDATIONS:   1) Type 1 Diabetes Mellitus, Sub-Optimally controlled, With CKD III, retinopathy and macrovascular  complications - Most recent A1c of 7.5 %. Goal A1c < 7.5%.     - A1c trending down but continues to be above goal - He has been looking into insulin  pump technology, he is interested in the OmniPod as he is interested in tubeless pumps.  We did discuss the difference between the OmniPod, islet pump, as well as MOBI -I personally would recommend either the ilet or the Mobi pump for a type I DM, as the OmniPod is a better option for people with T2DM -Patient will look into this and will notify me when he has made a decision -He is up-to-date on fasting glucose and C-peptide - No changes at this time    MEDICATIONS: -Continue Lantus  11 units daily  -Continue  Novolog  I:C ratio to 1:12 with breakfast, 1:12 with lunch, 1:8 with supper -Continue CF :Novolog  (BG- 100/50)     EDUCATION / INSTRUCTIONS: BG monitoring instructions: Patient is instructed to check his blood sugars 4 times a day, before meals and bedtime . Call [facility]  if: BG persistently < 70 I reviewed the Rule of 15 for the treatment of hypoglycemia in detail with the patient. Literature supplied.    2) Diabetic complications:  Eye: Does have known diabetic retinopathy.  Neuro/ Feet: Does not have known diabetic peripheral neuropathy .  Renal: Patient does have known baseline CKD. He   is  on an ACEI/ARB at present. Urine albumin/creatinine ratio today is elevated     F/U in 6 months     In addition to time spent performing glucose monitor, I spent 25 minutes preparing to see the patient by review of recent labs, imaging and procedures, obtaining and reviewing separately obtained history, communicating with the patient, ordering medications, tests or procedures, and documenting clinical information in the EHR including the differential Dx, treatment, and any further evaluation and other management   Signed electronically by: Stefano Redgie Butts, MD  [facility] Endocrinology  [facility] [address] [place] [place], [place] [address] Phone: [phone] FAX: [phone]   CC: Mercer Clotilda SAUNDERS, MD [address] [place] [address] Phone: [phone]  Fax: [phone]  Return to Endocrinology clinic as below: No future appointments.       "

## 2024-10-13 ENCOUNTER — Ambulatory Visit: Admitting: Internal Medicine
